# Patient Record
Sex: Female | Born: 1965 | Race: White | Hispanic: No | Marital: Single | Smoking: Former smoker
Health system: Southern US, Community
[De-identification: ages and names within clinical notes are randomized; demographics above are authoritative.]

## PROBLEM LIST (undated history)

## (undated) DIAGNOSIS — D759 Disease of blood and blood-forming organs, unspecified: Secondary | ICD-10-CM

## (undated) DIAGNOSIS — E785 Hyperlipidemia, unspecified: Secondary | ICD-10-CM

## (undated) DIAGNOSIS — L109 Pemphigus, unspecified: Secondary | ICD-10-CM

## (undated) DIAGNOSIS — F419 Anxiety disorder, unspecified: Secondary | ICD-10-CM

## (undated) HISTORY — PX: NO PAST SURGERIES: SHX2092

---

## 2004-03-26 ENCOUNTER — Ambulatory Visit: Payer: Self-pay | Admitting: Unknown Physician Specialty

## 2004-05-17 ENCOUNTER — Ambulatory Visit: Payer: Self-pay | Admitting: Internal Medicine

## 2004-05-31 ENCOUNTER — Ambulatory Visit: Payer: Self-pay | Admitting: Internal Medicine

## 2006-01-09 ENCOUNTER — Ambulatory Visit: Payer: Self-pay

## 2006-09-22 HISTORY — PX: AUGMENTATION MAMMAPLASTY: SUR837

## 2010-06-06 ENCOUNTER — Ambulatory Visit: Payer: Self-pay

## 2010-12-21 ENCOUNTER — Other Ambulatory Visit: Payer: Self-pay | Admitting: Obstetrics and Gynecology

## 2010-12-21 DIAGNOSIS — Z1231 Encounter for screening mammogram for malignant neoplasm of breast: Secondary | ICD-10-CM

## 2010-12-31 ENCOUNTER — Ambulatory Visit
Admission: RE | Admit: 2010-12-31 | Discharge: 2010-12-31 | Disposition: A | Discharge status: Home / Self Care | Payer: Managed Care, Other (non HMO) | Source: Ambulatory Visit | Attending: Obstetrics and Gynecology | Admitting: Obstetrics and Gynecology

## 2010-12-31 DIAGNOSIS — Z1231 Encounter for screening mammogram for malignant neoplasm of breast: Secondary | ICD-10-CM

## 2011-01-01 ENCOUNTER — Other Ambulatory Visit: Payer: Self-pay | Admitting: Obstetrics and Gynecology

## 2011-01-01 DIAGNOSIS — R928 Other abnormal and inconclusive findings on diagnostic imaging of breast: Secondary | ICD-10-CM

## 2011-01-17 ENCOUNTER — Ambulatory Visit
Admission: RE | Admit: 2011-01-17 | Discharge: 2011-01-17 | Disposition: A | Discharge status: Home / Self Care | Payer: Managed Care, Other (non HMO) | Source: Ambulatory Visit | Attending: Obstetrics and Gynecology | Admitting: Obstetrics and Gynecology

## 2011-01-17 DIAGNOSIS — R928 Other abnormal and inconclusive findings on diagnostic imaging of breast: Secondary | ICD-10-CM

## 2011-09-03 ENCOUNTER — Observation Stay (HOSPITAL_COMMUNITY)
Admission: EM | Admit: 2011-09-03 | Discharge: 2011-09-04 | Disposition: A | Discharge status: Home / Self Care | Payer: 59 | Attending: Family Medicine | Admitting: Family Medicine

## 2011-09-03 ENCOUNTER — Encounter (HOSPITAL_COMMUNITY): Payer: Self-pay | Admitting: Emergency Medicine

## 2011-09-03 ENCOUNTER — Emergency Department (HOSPITAL_COMMUNITY): Payer: 59

## 2011-09-03 ENCOUNTER — Observation Stay (HOSPITAL_COMMUNITY): Payer: 59

## 2011-09-03 DIAGNOSIS — L100 Pemphigus vulgaris: Secondary | ICD-10-CM | POA: Diagnosis present

## 2011-09-03 DIAGNOSIS — R29898 Other symptoms and signs involving the musculoskeletal system: Secondary | ICD-10-CM | POA: Insufficient documentation

## 2011-09-03 DIAGNOSIS — Z23 Encounter for immunization: Secondary | ICD-10-CM | POA: Insufficient documentation

## 2011-09-03 DIAGNOSIS — G459 Transient cerebral ischemic attack, unspecified: Principal | ICD-10-CM | POA: Diagnosis present

## 2011-09-03 DIAGNOSIS — R4789 Other speech disturbances: Secondary | ICD-10-CM | POA: Insufficient documentation

## 2011-09-03 DIAGNOSIS — F411 Generalized anxiety disorder: Secondary | ICD-10-CM | POA: Insufficient documentation

## 2011-09-03 DIAGNOSIS — R209 Unspecified disturbances of skin sensation: Secondary | ICD-10-CM | POA: Insufficient documentation

## 2011-09-03 DIAGNOSIS — E785 Hyperlipidemia, unspecified: Secondary | ICD-10-CM | POA: Diagnosis present

## 2011-09-03 DIAGNOSIS — R413 Other amnesia: Secondary | ICD-10-CM | POA: Diagnosis present

## 2011-09-03 DIAGNOSIS — F419 Anxiety disorder, unspecified: Secondary | ICD-10-CM | POA: Diagnosis present

## 2011-09-03 DIAGNOSIS — R29810 Facial weakness: Secondary | ICD-10-CM | POA: Insufficient documentation

## 2011-09-03 DIAGNOSIS — L109 Pemphigus, unspecified: Secondary | ICD-10-CM | POA: Insufficient documentation

## 2011-09-03 HISTORY — DX: Disease of blood and blood-forming organs, unspecified: D75.9

## 2011-09-03 HISTORY — DX: Anxiety disorder, unspecified: F41.9

## 2011-09-03 HISTORY — DX: Pemphigus, unspecified: L10.9

## 2011-09-03 HISTORY — DX: Hyperlipidemia, unspecified: E78.5

## 2011-09-03 LAB — CBC
MCH: 31.1 pg (ref 26.0–34.0)
MCV: 94.5 fL (ref 78.0–100.0)
Platelets: 292 10*3/uL (ref 150–400)
RDW: 13.1 % (ref 11.5–15.5)

## 2011-09-03 LAB — GLUCOSE, CAPILLARY
Glucose-Capillary: 162 mg/dL — ABNORMAL HIGH (ref 70–99)
Glucose-Capillary: 104 mg/dL — ABNORMAL HIGH (ref 70–99)

## 2011-09-03 LAB — COMPREHENSIVE METABOLIC PANEL
AST: 14 U/L (ref 0–37)
Albumin: 3.8 g/dL (ref 3.5–5.2)
Calcium: 8.6 mg/dL (ref 8.4–10.5)
Creatinine, Ser: 1.08 mg/dL (ref 0.50–1.10)
Total Protein: 6.3 g/dL (ref 6.0–8.3)

## 2011-09-03 LAB — PROTIME-INR: INR: 1.08 (ref 0.00–1.49)

## 2011-09-03 LAB — HEMOGLOBIN A1C
Hgb A1c MFr Bld: 5.8 % — ABNORMAL HIGH (ref <5.7)
Mean Plasma Glucose: 120 mg/dL — ABNORMAL HIGH (ref <117)

## 2011-09-03 LAB — RAPID URINE DRUG SCREEN, HOSP PERFORMED
Amphetamines: NOT DETECTED
Benzodiazepines: NOT DETECTED
Opiates: NOT DETECTED

## 2011-09-03 LAB — APTT: aPTT: 31 seconds (ref 24–37)

## 2011-09-03 LAB — LIPID PANEL
Total CHOL/HDL Ratio: 1.8 RATIO
VLDL: 10 mg/dL (ref 0–40)

## 2011-09-03 LAB — C4 COMPLEMENT: Complement C4, Body Fluid: 26 mg/dL (ref 10–40)

## 2011-09-03 MED ORDER — PREDNISONE 5 MG PO TABS
15.0000 mg | ORAL_TABLET | Freq: Every day | ORAL | Status: DC
  Administered 2011-09-03 – 2011-09-04 (×2): 15 mg via ORAL
  Filled 2011-09-03 (×3): qty 1

## 2011-09-03 MED ORDER — MYCOPHENOLATE MOFETIL 250 MG PO CAPS
1500.0000 mg | ORAL_CAPSULE | Freq: Two times a day (BID) | ORAL | Status: DC
  Administered 2011-09-03 – 2011-09-04 (×3): 1500 mg via ORAL
  Filled 2011-09-03 (×4): qty 6

## 2011-09-03 MED ORDER — ALPRAZOLAM 0.5 MG PO TABS
0.5000 mg | ORAL_TABLET | Freq: Two times a day (BID) | ORAL | Status: DC | PRN
  Administered 2011-09-03 – 2011-09-04 (×2): 0.5 mg via ORAL
  Filled 2011-09-03 (×2): qty 1

## 2011-09-03 MED ORDER — ENOXAPARIN SODIUM 40 MG/0.4ML ~~LOC~~ SOLN
40.0000 mg | Freq: Every day | SUBCUTANEOUS | Status: DC
  Administered 2011-09-03 – 2011-09-04 (×2): 40 mg via SUBCUTANEOUS
  Filled 2011-09-03 (×2): qty 0.4

## 2011-09-03 MED ORDER — ZOLPIDEM TARTRATE 5 MG PO TABS
5.0000 mg | ORAL_TABLET | Freq: Every day | ORAL | Status: DC
  Administered 2011-09-03: 5 mg via ORAL
  Filled 2011-09-03: qty 1

## 2011-09-03 MED ORDER — SODIUM CHLORIDE 0.9 % IV SOLN
INTRAVENOUS | Status: DC
  Administered 2011-09-03: 05:00:00 via INTRAVENOUS
  Administered 2011-09-04: 75 mL/h via INTRAVENOUS

## 2011-09-03 MED ORDER — ASPIRIN 325 MG PO TABS
325.0000 mg | ORAL_TABLET | Freq: Every day | ORAL | Status: DC
  Administered 2011-09-03 – 2011-09-04 (×2): 325 mg via ORAL
  Filled 2011-09-03 (×2): qty 1

## 2011-09-03 MED ORDER — LAMOTRIGINE 200 MG PO TABS
400.0000 mg | ORAL_TABLET | Freq: Every day | ORAL | Status: DC
  Administered 2011-09-03: 400 mg via ORAL
  Filled 2011-09-03 (×2): qty 2

## 2011-09-03 MED ORDER — PNEUMOCOCCAL VAC POLYVALENT 25 MCG/0.5ML IJ INJ
0.5000 mL | INJECTION | INTRAMUSCULAR | Status: AC
  Administered 2011-09-04: 0.5 mL via INTRAMUSCULAR
  Filled 2011-09-03: qty 0.5

## 2011-09-03 NOTE — Progress Notes (Signed)
Called by Dr. Rito Ehrlich with internal medicine to arrange outpatient monitoring to look for arrhythmia given admission for TIA. Discussed with Dr. Excell Seltzer [facility] DOD) - we will assign Excell Seltzer to receive the results to ensure they are reviewed given that she was not seen by cardiology this admission. This will be a 21-day continuous monitor. She can pick up the monitor Thursday 09/05/11 at 10:30am at [facility].  Jaimey Franchini PA-C

## 2011-09-03 NOTE — Code Documentation (Signed)
Patient arrived EMS due to difficulty speaking and right arm heaviness. The patient is a poor historian and patient's husband's report differs on when the patient went to bed normal and when she woke up with symptoms. The patient reports she went to bed at 2130 and awoke at 2300 with symptoms, the patient's husband reports she went to bed at 2300 and awoke at midnight with symptoms. EMS did not encode the patient, code stroke was called by EDP after arrival to ED. Code stroke called at 0117, patient arrived at 0053, EDP exam at 0115, stroke team arrived at 0120, LSN is unclear, patient arrived to CT at 0127, phlebotomist arrived at 0120, CT read by Dr. Roseanne Reno at [phone]. NIH 0, code stroke cancelled at 0142.

## 2011-09-03 NOTE — ED Notes (Signed)
REPORT GIVEN TO 4 NORTH FLOOR NURSE , TRANSPORTED IN STABLE CONDITION , DENIES PAIN OR DISCOMFORT. RESPIRATIONS UNLABORED.

## 2011-09-03 NOTE — Progress Notes (Signed)
*  PRELIMINARY RESULTS* Vascular Ultrasound Carotid Duplex (Doppler) has been completed.  Preliminary findings: Bilaterally no significant ICA stenosis with antegrade vertebral flow.  Farrel Demark, RDMS, RVT  09/03/2011, 3:30 PM

## 2011-09-03 NOTE — ED Notes (Signed)
[phone] Heather Baxter cell. [phone] Heather Baxter home

## 2011-09-03 NOTE — Progress Notes (Signed)
Utilization review complete 

## 2011-09-03 NOTE — Progress Notes (Signed)
Stroke Team Progress Note  HISTORY Heather Baxter is an 46 y.o. female with a history of hyperlipidemia and chronic anxiety, presenting with history of transient slurring of speech as well as right facial and upper extremity weakness. She also had a feeling of tingling involving her right hand. Patient was last known normal at about 9:30 PM on 09/01/2101. She woke up at 2300 with current symptoms. Symptoms resolved shortly after arriving in the emergency room. There is no previous history stroke nor TIA. Patient has not been on antiplatelet therapy. NIH stroke score at this point is 0. Patient was not a TPA candidate secondary to resolution of symptoms. She was admitted for further evaluation and treatment.  SUBJECTIVE Her husband is at the bedside.  Overall she feels her condition is unchanged. Symptoms lasted ~ 5 minutes last night.  OBJECTIVE Most recent Vital Signs: Filed Vitals:   09/03/11 0500 09/03/11 0937 09/03/11 1407 09/03/11 1431  BP: 122/75 120/68 114/68 149/54  Pulse: 71 67 65 67  Temp: 98.1 F (36.7 C) 98.5 F (36.9 C) 98.3 F (36.8 C) 98.4 F (36.9 C)  TempSrc:  Oral Oral Oral  Resp: 18 18 18 18   Height: 5\' 4"  (1.626 m)     Weight: 78.518 kg (173 lb 1.6 oz)     SpO2: 99% 99% 98% 98%   CBG (last 3)  Basename 09/03/11 1146 09/03/11 0313  GLUCAP 101* 162*   IV Fluid Intake:     . sodium chloride 75 mL/hr at 09/03/11 0502   MEDICATIONS    . aspirin  325 mg Oral Daily  . enoxaparin (LOVENOX) injection  40 mg Subcutaneous Daily  . lamoTRIgine  400 mg Oral QHS  . mycophenolate  1,500 mg Oral BID  . pneumococcal 23 valent vaccine  0.5 mL Intramuscular Tomorrow-1000  . predniSONE  15 mg Oral Q breakfast  . zolpidem  5 mg Oral QHS   PRN:    Diet:  Cardiac thin liquids Activity:   Bathroom privileges with assistance DVT Prophylaxis:  Lovenox 40 mg sq daily   CLINICALLY SIGNIFICANT STUDIES Basic Metabolic Panel:  Lab 09/03/11 1478  NA 140  K 3.6  CL 107  CO2 24   GLUCOSE 154*  BUN 14  CREATININE 1.08  CALCIUM 8.6  MG --  PHOS --   Liver Function Tests:  Lab 09/03/11 0120  AST 14  ALT 13  ALKPHOS 32*  BILITOT 0.4  PROT 6.3  ALBUMIN 3.8   CBC:  Lab 09/03/11 0120  WBC 9.0  NEUTROABS --  HGB 13.5  HCT 41.0  MCV 94.5  PLT 292   Coagulation:  Lab 09/03/11 0120  LABPROT 14.2  INR 1.08   Cardiac Enzymes:  Lab 09/03/11 0250  CKTOTAL --  CKMB --  CKMBINDEX --  TROPONINI <0.30   Urinalysis: No results found for this basename: COLORURINE:2,APPERANCEUR:2,LABSPEC:2,PHURINE:2,GLUCOSEU:2,HGBUR:2,BILIRUBINUR:2,KETONESUR:2,PROTEINUR:2,UROBILINOGEN:2,NITRITE:2,LEUKOCYTESUR:2 in the last 168 hours Lipid Panel    Component Value Date/Time   CHOL 169 09/03/2011 0700   TRIG 48 09/03/2011 0700   HDL 92 09/03/2011 0700   CHOLHDL 1.8 09/03/2011 0700   VLDL 10 09/03/2011 0700   LDLCALC 67 09/03/2011 0700   HgbA1C  Lab Results  Component Value Date   HGBA1C 5.8* 09/03/2011    Urine Drug Screen:     Component Value Date/Time   LABOPIA NONE DETECTED 09/03/2011 0230   COCAINSCRNUR NONE DETECTED 09/03/2011 0230   LABBENZ NONE DETECTED 09/03/2011 0230   AMPHETMU NONE DETECTED 09/03/2011 0230   THCU NONE  DETECTED 09/03/2011 0230   LABBARB NONE DETECTED 09/03/2011 0230    Alcohol Level: No results found for this basename: ETH:2 in the last 168 hours  CT of the brain  09/03/2011   Negative CT head.   MRI of the brain  09/03/2011   No acute infarct.  Minimal nonspecific white matter type changes as noted above. : Mild branch vessel irregularity.  MRA of the brain  09/03/2011 Mild branch vessel irregularity.   2D Echocardiogram    Carotid Doppler    CXR  09/03/2011   No acute cardiopulmonary process.    EKG     Therapy Recommendations no rehab needs  Physical Exam    GENERAL EXAM: Patient is in no distress  CARDIOVASCULAR: Regular rate and rhythm, no murmurs, no carotid bruits  NEUROLOGIC: MENTAL STATUS: awake, alert, language fluent,  comprehension intact, naming intact CRANIAL NERVE: pupils equal and reactive to light, visual fields full to confrontation, extraocular muscles intact, no nystagmus, facial sensation and strength symmetric, uvula midline, shoulder shrug symmetric, tongue midline. MOTOR: normal bulk and tone, full strength in the BUE, BLE SENSORY: normal and symmetric to light touch, pinprick, temperature, vibration COORDINATION: finger-nose-finger, fine finger movements normal REFLEXES: deep tendon reflexes present and symmetric  ASSESSMENT Ms. Heather Baxter is a 46 y.o. female presenting with transient slurred speech, right face and right arm weakness, right hand tingling. Imaging confirms no infarct. Symptoms secondary to TIA. Stroke workup underway.  On no antiplatelets prior to admission. Now on aspirin 325 mg orally every day for secondary stroke prevention. Given history of prior symptoms and nonspecific lesion on MRI, will consider outpatient workup of non-specific white matter spots on MRI brain (i.e. Multiple sclerosis workup).  -Pemphigus, on predisone, cellcept, lamictal -cigarette smoking,  Hospital day # 0  TREATMENT/PLAN - Continue aspirin 325 mg orally every day for secondary stroke prevention. - outpatient telemetry monitoring has been arranged to assess patient for atrial fibrillation as source of stroke with [facility].  - TEE tomorrow by [employer]. TEE to look for embolic source.  If positive for PFO (patent foramen ovale), check bilateral lower extremity venous dopplers to rule out DVT as possible source of stroke.  - check Hypercoagulable panel, vasculitic labs (C3, C4, CH50, ANA, ESR) and RPR  SHARON BIBY, MSN, RN, ANVP-BC, ANP-BC, GNP-BC [facility] Pager: [phone] 09/03/2011 2:59 PM  Scribe for Dr. Joycelyn Schmid, who has personally reviewed chart, pertinent data, examined the patient and developed the plan of care. Pager:  [phone]   [facility] - Stroke Team Joycelyn Schmid, MD 09/03/2011, 3:23 PM   Please refer to [email or web address] for on-call Stroke MD

## 2011-09-03 NOTE — Consult Note (Signed)
  Chief Complaint: Dizziness, dysarthria, right facial and upper extremity weakness.  HPI: Heather Baxter is an 46 y.o. female with a history of hyperlipidemia and chronic anxiety, presenting with history of transient slurring of speech as well as right facial and upper extremity weakness. She also had a feeling of tingling involving her right hand. Patient was last known normal at about 9:30 PM. She woke up at 2300 with current symptoms. Symptoms resolved shortly after arriving in the emergency room. There is no previous history stroke nor TIA. Patient has not been on antiplatelet therapy. NIH stroke score at this point is 0.  LSN: 9:30 PM on 09/02/2011 tPA Given: No: Rapid resolution of deficits MRankin: 0  Past Medical History  Diagnosis Date  . Anxiety     No family history on file.   Medications:  Prior to Admission: Lamictal 400 mg at bedtime CellCept 1500 mg twice a day Prednisone 15 mg per day Crestor 5 mg per day Ambien 5 mg at bedtime  Physical Examination: Blood pressure 146/96, pulse 96, temperature 98.6 F (37 C), temperature source Oral, resp. rate 11, SpO2 99.00%.  Neurologic Examination: Mental Status: Alert, oriented, thought content appropriate.  Speech fluent without evidence of aphasia. Able to follow commands without difficulty. Cranial Nerves: II-Visual fields were normal. III/IV/VI-Pupils were equal and reacted. Extraocular movements were full and conjugate.    V/VII-no facial numbness and no facial weakness. VIII-normal. X-normal speech and symmetrical palatal movement. Motor: 5/5 bilaterally with normal tone and bulk Sensory: Normal throughout. Deep Tendon Reflexes: 2+ and symmetric. Plantars: Flexor bilaterally Cerebellar: Normal finger-to-nose testing. Carotid auscultation: Normal   Ct Head Wo Contrast  09/03/2011  *RADIOLOGY REPORT*  Clinical Data:   Stroke.  Arm heaviness.  CT HEAD WITHOUT CONTRAST  Technique:  Contiguous axial images were  obtained from the base of the skull through the vertex without contrast.  Comparison: None.  Findings: No mass lesion, mass effect, midline shift, hydrocephalus, hemorrhage.  No territorial ischemia or acute infarction.  Paranasal sinuses are within normal limits.  IMPRESSION: Negative CT head.  Critical Value/emergent results were called by telephone at the time of interpretation on 09/03/2011 at 0134 hours to Dr. Roseanne Reno, who verbally acknowledged these results.  Original Report Authenticated By: Andreas Newport, M.D.    Assessment: 46 y.o. female presenting with possible left subcortical small vessel MCA territory TIA. Small vessel ischemic stroke cannot be ruled out at this point as well.  Stroke Risk Factors - hyperlipidemia  Plan: 1. HgbA1c, fasting lipid panel 2. MRI, MRA  of the brain without contrast 3. Echocardiogram 4. Carotid dopplers 5. Prophylactic therapy-Antiplatelet med: Aspirin 81 mg per day 6. Risk factor modification 7. Telemetry monitoring  C.R. Roseanne Reno, MD [facility] [phone]  09/03/2011, 1:49 AM

## 2011-09-03 NOTE — ED Notes (Signed)
Patient with slurred speech and difficulty swallowing and speaking.  Patient LSN at 11pm.  Went to sleep, woke up at 1am, now having problems and having trouble walking.

## 2011-09-03 NOTE — ED Provider Notes (Signed)
History     CSN: [id number]  Arrival date & time 09/03/11  0053   First MD Initiated Contact with Patient 09/03/11 0241      CC: Code Stroke  (Consider location/radiation/quality/duration/timing/severity/associated sxs/prior treatment) HPI Heather Baxter is an 46 y.o. female with a history of hyperlipidemia and chronic anxiety, presenting with history of transient slurring of speech as well as right facial and upper extremity weakness. She also had a feeling of tingling involving her right hand. Patient was last known normal at about 9:30 PM. She woke up at 2300 with current symptoms. Symptoms resolved shortly after arriving in the emergency room. There is no previous history stroke nor TIA. She is no headache or confusion. She is no chest pain palpitations or shortness breath. She is no nausea vomiting. She is no vertigo. She now feels normal. Her symptoms were mild and now resolved.tPA in stroke considered, but not given due to the following: Symptoms resolved   Past Medical History  Diagnosis Date  . Anxiety   . Pemphigus   . Hyperlipidemia     Past Surgical History  Procedure Date  . No past surgeries     Family History  Problem Relation Age of Onset  . Hypertension Other     History  Substance Use Topics  . Smoking status: Former Games developer  . Smokeless tobacco: Not on file  . Alcohol Use: Yes     occassionaly    OB History    Grav Para Term Preterm Abortions TAB SAB Ect Mult Living                  Review of Systems 10 Systems reviewed and are negative for acute change except as noted in the HPI. Allergies  Review of patient's allergies indicates no known allergies.  Home Medications   No current outpatient prescriptions on file.  BP 125/77  Pulse 75  Temp 98.1 F (36.7 C) (Oral)  Resp 18  Ht 5\' 4"  (1.626 m)  Wt 173 lb 1.6 oz (78.518 kg)  BMI 29.71 kg/m2  SpO2 98%  Physical Exam  Nursing note and vitals reviewed. Constitutional:       Awake,  alert, nontoxic appearance with baseline speech for patient.  HENT:  Head: Atraumatic.  Mouth/Throat: No oropharyngeal exudate.  Eyes: EOM are normal. Pupils are equal, round, and reactive to light. Right eye exhibits no discharge. Left eye exhibits no discharge.  Neck: Neck supple.  Cardiovascular: Normal rate and regular rhythm.   No murmur heard. Pulmonary/Chest: Effort normal and breath sounds normal. No stridor. No respiratory distress. She has no wheezes. She has no rales. She exhibits no tenderness.  Abdominal: Soft. Bowel sounds are normal. She exhibits no mass. There is no tenderness. There is no rebound.  Musculoskeletal: She exhibits no tenderness.       Baseline ROM, moves extremities with no obvious new focal weakness.  Lymphadenopathy:    She has no cervical adenopathy.  Neurological:       Awake, alert, cooperative and aware of situation; motor strength bilaterally; sensation normal to light touch bilaterally; peripheral visual fields full to confrontation; no facial asymmetry; tongue midline; major cranial nerves appear intact; no pronator drift, normal finger to nose bilaterally  Skin: No rash noted.  Psychiatric: She has a normal mood and affect.    ED Course  Procedures (including critical care time)  Labs Reviewed  COMPREHENSIVE METABOLIC PANEL - Abnormal; Notable for the following:    Glucose, Bld 154 (*)  Alkaline Phosphatase 32 (*)     GFR calc non Af Amer 61 (*)     GFR calc Af Amer 71 (*)     All other components within normal limits  GLUCOSE, CAPILLARY - Abnormal; Notable for the following:    Glucose-Capillary 162 (*)     All other components within normal limits  HEMOGLOBIN A1C - Abnormal; Notable for the following:    Hemoglobin A1C 5.8 (*)     Mean Plasma Glucose 120 (*)     All other components within normal limits  GLUCOSE, CAPILLARY - Abnormal; Notable for the following:    Glucose-Capillary 101 (*)     All other components within normal  limits  GLUCOSE, CAPILLARY - Abnormal; Notable for the following:    Glucose-Capillary 104 (*)     All other components within normal limits  CBC  PROTIME-INR  URINE RAPID DRUG SCREEN (HOSP PERFORMED)  PREGNANCY, URINE  APTT  TROPONIN I  LIPID PANEL  ANTITHROMBIN III  SEDIMENTATION RATE  GLUCOSE, POCT (MANUAL RESULT ENTRY)  GLUCOSE, POCT (MANUAL RESULT ENTRY)  GLUCOSE, POCT (MANUAL RESULT ENTRY)  GLUCOSE, POCT (MANUAL RESULT ENTRY)  CBC  PROTEIN C ACTIVITY  PROTEIN C, TOTAL  PROTEIN S ACTIVITY  PROTEIN S, TOTAL  LUPUS ANTICOAGULANT PANEL  BETA-2-GLYCOPROTEIN I ABS, IGG/M/A  HOMOCYSTEINE, SERUM  FACTOR 5 LEIDEN  CARDIOLIPIN ANTIBODIES, IGG, IGM, IGA  RPR  COMPLEMENT, TOTAL  ANA  C4 COMPLEMENT  C3 COMPLEMENT   Ct Head Wo Contrast  09/03/2011  *RADIOLOGY REPORT*  Clinical Data:   Stroke.  Arm heaviness.  CT HEAD WITHOUT CONTRAST  Technique:  Contiguous axial images were obtained from the base of the skull through the vertex without contrast.  Comparison: None.  Findings: No mass lesion, mass effect, midline shift, hydrocephalus, hemorrhage.  No territorial ischemia or acute infarction.  Paranasal sinuses are within normal limits.  IMPRESSION: Negative CT head.  Critical Value/emergent results were called by telephone at the time of interpretation on 09/03/2011 at 0134 hours to Dr. Roseanne Reno, who verbally acknowledged these results.  Original Report Authenticated By: Andreas Newport, M.D.   Mri Brain Without Contrast  09/03/2011  *RADIOLOGY REPORT*  Clinical Data:  Difficulty speaking with right facial droop and numbness right hand.  Hyperlipidemia.  MRI BRAIN WITHOUT CONTRAST MRA HEAD WITHOUT CONTRAST  Technique: Multiplanar, multiecho pulse sequences of the brain and surrounding structures were obtained according to standard protocol without intravenous contrast.  Angiographic images of the head were obtained using MRA technique without contrast.  Comparison: 09/03/2011 CT.  No  comparison MR.  MRI HEAD  Findings:  No acute infarct.  No intracranial hemorrhage.  No intracranial mass lesion detected on this unenhanced exam.  No hydrocephalus.  Minimal punctate/patchy white matter type changes most notable superior frontal lobes may represent changes of small vessel disease in this hyperlipidemic patient.  Demyelinating process not entirely excluded.  Other considerations include that secondary to the; migraine headaches, vasculitis, prior trauma or inflammatory process.  Minimal exophthalmos.  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  Air-fluid level right maxillary sinus versus polypoid lesion. Minimal mucosal thickening ethmoid sinus air cells.  IMPRESSION: No acute infarct.  Minimal nonspecific white matter type changes as noted above.  MRA HEAD  Findings: Nonvisualization left PICA.  Duplicated left AICA.  No high-grade stenosis of the distal vertebral arteries or basilar artery.  Anterior circulation without medium or large size vessel significant stenosis or occlusion.  Bulge of the right middle cerebral artery and left posterior communicating artery  origin without discrete aneurysm as these appear to be origin of the vessels.  Mild branch vessel irregularity.  IMPRESSION: Mild branch vessel irregularity.  Please see above.  Original Report Authenticated By: Fuller Canada, M.D.   Dg Chest Port 1 View  09/03/2011  *RADIOLOGY REPORT*  Clinical Data: Smoking history.  Rule out mass.  PORTABLE CHEST - 1 VIEW  Comparison: MRI brain 09/03/2011  Findings: Normal cardiac silhouette.  No effusion, infiltrate, pneumothorax.  No evidence of lung mass.  IMPRESSION: No acute cardiopulmonary process.  Original Report Authenticated By: Genevive Bi, M.D.   Mr Maxine Glenn Head/brain Wo Cm  09/03/2011  *RADIOLOGY REPORT*  Clinical Data:  Difficulty speaking with right facial droop and numbness right hand.  Hyperlipidemia.  MRI BRAIN WITHOUT CONTRAST MRA HEAD WITHOUT CONTRAST  Technique: Multiplanar, multiecho  pulse sequences of the brain and surrounding structures were obtained according to standard protocol without intravenous contrast.  Angiographic images of the head were obtained using MRA technique without contrast.  Comparison: 09/03/2011 CT.  No comparison MR.  MRI HEAD  Findings:  No acute infarct.  No intracranial hemorrhage.  No intracranial mass lesion detected on this unenhanced exam.  No hydrocephalus.  Minimal punctate/patchy white matter type changes most notable superior frontal lobes may represent changes of small vessel disease in this hyperlipidemic patient.  Demyelinating process not entirely excluded.  Other considerations include that secondary to the; migraine headaches, vasculitis, prior trauma or inflammatory process.  Minimal exophthalmos.  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  Air-fluid level right maxillary sinus versus polypoid lesion. Minimal mucosal thickening ethmoid sinus air cells.  IMPRESSION: No acute infarct.  Minimal nonspecific white matter type changes as noted above.  MRA HEAD  Findings: Nonvisualization left PICA.  Duplicated left AICA.  No high-grade stenosis of the distal vertebral arteries or basilar artery.  Anterior circulation without medium or large size vessel significant stenosis or occlusion.  Bulge of the right middle cerebral artery and left posterior communicating artery origin without discrete aneurysm as these appear to be origin of the vessels.  Mild branch vessel irregularity.  IMPRESSION: Mild branch vessel irregularity.  Please see above.  Original Report Authenticated By: Fuller Canada, M.D.     1. TIA (transient ischemic attack)   2. Pemphigus vulgaris       MDM  Pt stable in ED with no significant deterioration in condition.Patient / Family / Caregiver informed of clinical course, understand medical decision-making process, and agree with plan.          Hurman Horn, MD 09/03/11 [phone]

## 2011-09-03 NOTE — Progress Notes (Signed)
[facility] PROGRESS NOTE  Heather Baxter MRN:[id number] DOB: 10/05/1965 DOA: 09/03/2011 PCP: [name], Janet S, MD  Assessment/Plan: Principal Problem:  *TIA (transient ischemic attack) Active Problems:  Pemphigus vulgaris  Hyperlipidemia  Anxiety  Memory loss or impairment  1. TIA:  Workup so far has been negative with preliminary carotid Dopplers normal, and normal MRI/MRA and 2-D echo. Fasting lipid panel noting good LDL results patient has been on Crestor for the last one year. A1c notes borderline but no actual diagnosis of diabetes (patient counseled on weight loss) and blood pressures in the hospital have been fine. Plan is for TEE tomorrow to be done by [facility] and if normal, patient can go home. She has a followup appointment with [facility] on Thursday 09/05/11 for Holter monitor. 2. Pemphigus full there is: Stable. 3. Hyperlipidemia: Crestor. Lipid profile looks good. 4. Anxiety: Patient on when necessary Xanax. Stable. 5. Memory loss: Patient reports this has been going on for some time and husband confided that it has been much more noticeable in the past few weeks. From the sound of events, I actually suspect that this may be related to her Ambien use and advised that patient change medications. This can be managed by her PCP.  Code Status: Full code Family Communication: Discussed plan with patient and husband Disposition Plan: Home tomorrow after TEE, if normal   Brief narrative: Patient presented with arm numbness and facial droop concerning for possible TIA which resolved in the emergency room. Workup in progress  Consultants:  Stroke service  Procedures:  Status post echocardiogram done 8/13: Unremarkable  Carotid Dopplers done 8/13: Preliminary normal  Antibiotics:  None  HPI/Subjective: Patient has no complaints. No facial drooping or arm numbness or slurred speech or confusion. No headache.  Objective: Filed Vitals:   09/03/11  0937 09/03/11 1407 09/03/11 1431 09/03/11 1733  BP: 120/68 114/68 149/54 125/77  Pulse: 67 65 67 75  Temp: 98.5 F (36.9 C) 98.3 F (36.8 C) 98.4 F (36.9 C) 98.1 F (36.7 C)  TempSrc: Oral Oral Oral Oral  Resp: 18 18 18 18  Height:      Weight:      SpO2: 99% 98% 98% 98%   No intake or output data in the 24 hours ending 09/03/11 1820 Filed Weights   09/03/11 0500  Weight: 78.518 kg (173 lb 1.6 oz)    Exam:   General:  Alert and oriented x3, no acute distress, looks about stated age, fatigued  HEENT: Normocephalic, atraumatic, mucous membranes are moist  Neck: Supple no JVD no carotid bruits  Cardiovascular: Regular rate and rhythm, S1-S2  Respiratory: Clear to auscultation bilaterally  Abdomen: Soft, nontender, nondistended, positive bowel sounds  Musculoskeletal: No clubbing or cyanosis or edema  Neurological: Cranial nerves II through XII are intact, no focal deficits  Data Reviewed: Basic Metabolic Panel:  Lab 09/03/11 0120  NA 140  K 3.6  CL 107  CO2 24  GLUCOSE 154*  BUN 14  CREATININE 1.08  CALCIUM 8.6  MG --  PHOS --   Liver Function Tests:  Lab 09/03/11 0120  AST 14  ALT 13  ALKPHOS 32*  BILITOT 0.4  PROT 6.3  ALBUMIN 3.8   CBC:  Lab 09/03/11 0120  WBC 9.0  NEUTROABS --  HGB 13.5  HCT 41.0  MCV 94.5  PLT 292   Cardiac Enzymes:  Lab 09/03/11 0250  CKTOTAL --  CKMB --  CKMBINDEX --  TROPONINI <0.30   CBG:  Lab 09/03/11   1634 09/03/11 1146 09/03/11 0313  GLUCAP 104* 101* 162*     Studies: Ct Head Wo Contrast  09/03/2011 IMPRESSION: Negative CT head.    Mri Brain Without Contrast  09/03/2011   IMPRESSION: No acute infarct.  Minimal nonspecific white matter type changes as noted above.    MRA HEAD  Findings: Nonvisualization left PICA.  Duplicated left AICA.  No high-grade stenosis of the distal vertebral arteries or basilar artery.  Anterior circulation without medium or large size vessel significant stenosis or  occlusion.  Bulge of the right middle cerebral artery and left posterior communicating artery origin without discrete aneurysm as these appear to be origin of the vessels.  Mild branch vessel irregularity.  IMPRESSION: Mild branch vessel irregularity.  Dg Chest Port 1 View    IMPRESSION: No acute cardiopulmonary process.    Scheduled Meds:   . aspirin  325 mg Oral Daily  . enoxaparin (LOVENOX) injection  40 mg Subcutaneous Daily  . lamoTRIgine  400 mg Oral QHS  . mycophenolate  1,500 mg Oral BID  . pneumococcal 23 valent vaccine  0.5 mL Intramuscular Tomorrow-1000  . predniSONE  15 mg Oral Q breakfast  . zolpidem  5 mg Oral QHS   Continuous Infusions:   . sodium chloride 75 mL/hr at 09/03/11 0502     Time spent: 40 minutes    Milind Raether K  [facility] Pager [phone]. If 8PM-8AM, please contact night-coverage at [email or web address], password [email or web address] 09/03/2011, 6:20 PM  LOS: 0 days              

## 2011-09-03 NOTE — H&P (Signed)
Heather Baxter is an 46 y.o. female.   Patient was seen and examined on September 03, 2011 at 4:41 AM. PCP - none. Chief Complaint: Difficulty speaking with right facial droop and numbness of the right hand. HPI: 45 year-old female with history of pemphigus, anxiety, hyperlipidemia around last midnight was about to go to the bathroom when she suddenly felt dizzy and felt her right hand was getting numb and when she called her husband he fell that her right side of the face was drooping. They called the EMS and was brought to the ER. By the time she reached ER most of her symptoms are resolved except for mild numbness of the right hand. Patient did not lose consciousness and did not have any loss of strength in upper or lower extremities. Denies any palpitations chest pain or shortness of breath. CT of the head was negative for any acute. Neurologist on-call Dr. Roseanne Reno had seen the patient and will be admitted for further workup TIA.  Past Medical History  Diagnosis Date  . Anxiety   . Pemphigus   . Hyperlipidemia     Past Surgical History  Procedure Date  . No past surgeries     Family History  Problem Relation Age of Onset  . Hypertension Other    Social History:  reports that she has quit smoking. She does not have any smokeless tobacco history on file. She reports that she drinks alcohol. She reports that she does not use illicit drugs.  Allergies: No Known Allergies  Medications Prior to Admission  Medication Sig Dispense Refill  . lamoTRIgine (LAMICTAL) 200 MG tablet Take 400 mg by mouth at bedtime.      . mycophenolate (CELLCEPT) 500 MG tablet Take 1,500 mg by mouth 2 (two) times daily.      . predniSONE (DELTASONE) 5 MG tablet Take 15 mg by mouth daily.      [name] zolpidem (AMBIEN) 10 MG tablet Take 5 mg by mouth at bedtime.        Results for orders placed during the hospital encounter of 09/03/11 (from the past 48 hour(s))  CBC     Status: Normal   Collection Time   09/03/11   1:20 AM      Component Value Range Comment   WBC 9.0  4.0 - 10.5 K/uL    RBC 4.34  3.87 - 5.11 MIL/uL    Hemoglobin 13.5  12.0 - 15.0 g/dL    HCT 16.1  09.6 - 04.5 %    MCV 94.5  78.0 - 100.0 fL    MCH 31.1  26.0 - 34.0 pg    MCHC 32.9  30.0 - 36.0 g/dL    RDW 40.9  81.1 - 91.4 %    Platelets 292  150 - 400 K/uL   COMPREHENSIVE METABOLIC PANEL     Status: Abnormal   Collection Time   09/03/11  1:20 AM      Component Value Range Comment   Sodium 140  135 - 145 mEq/L    Potassium 3.6  3.5 - 5.1 mEq/L    Chloride 107  96 - 112 mEq/L    CO2 24  19 - 32 mEq/L    Glucose, Bld 154 (*) 70 - 99 mg/dL    BUN 14  6 - 23 mg/dL    Creatinine, Ser 7.82  0.50 - 1.10 mg/dL    Calcium 8.6  8.4 - 95.6 mg/dL    Total Protein 6.3  6.0 - 8.3  g/dL    Albumin 3.8  3.5 - 5.2 g/dL    AST 14  0 - 37 U/L    ALT 13  0 - 35 U/L    Alkaline Phosphatase 32 (*) 39 - 117 U/L    Total Bilirubin 0.4  0.3 - 1.2 mg/dL    GFR calc non Af Amer 61 (*) >90 mL/min    GFR calc Af Amer 71 (*) >90 mL/min   PROTIME-INR     Status: Normal   Collection Time   09/03/11  1:20 AM      Component Value Range Comment   Prothrombin Time 14.2  11.6 - 15.2 seconds    INR 1.08  0.00 - 1.49   URINE RAPID DRUG SCREEN (HOSP PERFORMED)     Status: Normal   Collection Time   09/03/11  2:30 AM      Component Value Range Comment   Opiates NONE DETECTED  NONE DETECTED    Cocaine NONE DETECTED  NONE DETECTED    Benzodiazepines NONE DETECTED  NONE DETECTED    Amphetamines NONE DETECTED  NONE DETECTED    Tetrahydrocannabinol NONE DETECTED  NONE DETECTED    Barbiturates NONE DETECTED  NONE DETECTED   PREGNANCY, URINE     Status: Normal   Collection Time   09/03/11  2:30 AM      Component Value Range Comment   Preg Test, Ur NEGATIVE  NEGATIVE   APTT     Status: Normal   Collection Time   09/03/11  2:50 AM      Component Value Range Comment   aPTT 31  24 - 37 seconds   TROPONIN I     Status: Normal   Collection Time   09/03/11  2:50  AM      Component Value Range Comment   Troponin I <0.30  <0.30 ng/mL   GLUCOSE, CAPILLARY     Status: Abnormal   Collection Time   09/03/11  3:13 AM      Component Value Range Comment   Glucose-Capillary 162 (*) 70 - 99 mg/dL    Ct Head Wo Contrast  09/03/2011  *RADIOLOGY REPORT*  Clinical Data:   Stroke.  Arm heaviness.  CT HEAD WITHOUT CONTRAST  Technique:  Contiguous axial images were obtained from the base of the skull through the vertex without contrast.  Comparison: None.  Findings: No mass lesion, mass effect, midline shift, hydrocephalus, hemorrhage.  No territorial ischemia or acute infarction.  Paranasal sinuses are within normal limits.  IMPRESSION: Negative CT head.  Critical Value/emergent results were called by telephone at the time of interpretation on 09/03/2011 at 0134 hours to Dr. Roseanne Reno, who verbally acknowledged these results.  Original Report Authenticated By: Andreas Newport, M.D.    Review of Systems  Constitutional: Negative.   HENT: Negative.   Eyes: Negative.   Respiratory: Negative.   Cardiovascular: Negative.   Gastrointestinal: Negative.   Genitourinary: Negative.   Musculoskeletal: Negative.   Skin: Negative.   Neurological: Positive for dizziness and speech change.       Drooping of face and numbness of right hand.  Endo/Heme/Allergies: Negative.   Psychiatric/Behavioral: Negative.     Blood pressure 127/86, pulse 82, temperature 98.3 F (36.8 C), temperature source Oral, resp. rate 14, SpO2 100.00%. Physical Exam  Constitutional: She is oriented to person, place, and time. She appears well-developed and well-nourished. No distress.  HENT:  Head: Normocephalic and atraumatic.  Right Ear: External ear normal.  Left Ear: External  ear normal.  Nose: Nose normal.  Mouth/Throat: Oropharynx is clear and moist. No oropharyngeal exudate.  Eyes: Conjunctivae are normal. Pupils are equal, round, and reactive to light. Right eye exhibits no discharge. Left  eye exhibits no discharge. No scleral icterus.  Neck: Normal range of motion. Neck supple.  Cardiovascular: Normal rate and regular rhythm.   Respiratory: Effort normal and breath sounds normal. No respiratory distress. She has no wheezes. She has no rales.  GI: Soft. Bowel sounds are normal. She exhibits no distension. There is no tenderness. There is no rebound.  Musculoskeletal: Normal range of motion. She exhibits no edema and no tenderness.  Neurological: She is alert and oriented to person, place, and time.       Moves all extremities 5/5. No facial asymmetry. Tongue is midline.  Skin: Skin is warm and dry. She is not diaphoretic.  Psychiatric: Her behavior is normal.     Assessment/Plan #1. Possible TIA - patient is placed on neurochecks, MRI/MRA brain has been ordered, swallow evaluation, 2-D echo and carotid Doppler. Telemetry shows sinus rhythm. #2. Hyperlipidemia - continue Crestor. #3. Anxiety - continue present medications. #4. Pemphigus - continue immunosuppressants.  CODE STATUS - full code.  Cailen Texeira N. 09/03/2011, 4:41 AM

## 2011-09-03 NOTE — Progress Notes (Signed)
*  PRELIMINARY RESULTS* Echocardiogram 2D Echocardiogram has been performed.  Heather Baxter 09/03/2011, 10:45 AM

## 2011-09-04 ENCOUNTER — Encounter (HOSPITAL_COMMUNITY): Payer: Self-pay | Admitting: *Deleted

## 2011-09-04 ENCOUNTER — Encounter (HOSPITAL_COMMUNITY)
Admission: EM | Disposition: A | Discharge status: Home / Self Care | Payer: Self-pay | Source: Home / Self Care | Attending: Emergency Medicine

## 2011-09-04 DIAGNOSIS — L109 Pemphigus, unspecified: Secondary | ICD-10-CM

## 2011-09-04 DIAGNOSIS — G459 Transient cerebral ischemic attack, unspecified: Secondary | ICD-10-CM

## 2011-09-04 HISTORY — PX: TEE WITHOUT CARDIOVERSION: SHX5443

## 2011-09-04 LAB — LUPUS ANTICOAGULANT PANEL: PTT Lupus Anticoagulant: 36.3 secs (ref 28.0–43.0)

## 2011-09-04 LAB — PROTEIN S ACTIVITY: Protein S Activity: 83 % (ref 69–129)

## 2011-09-04 LAB — GLUCOSE, CAPILLARY

## 2011-09-04 LAB — ANA: Anti Nuclear Antibody(ANA): NEGATIVE

## 2011-09-04 LAB — HOMOCYSTEINE: Homocysteine: 9.1 umol/L (ref 4.0–15.4)

## 2011-09-04 LAB — PROTEIN S, TOTAL: Protein S Ag, Total: 69 % (ref 60–150)

## 2011-09-04 LAB — FACTOR 5 LEIDEN

## 2011-09-04 SURGERY — ECHOCARDIOGRAM, TRANSESOPHAGEAL
Anesthesia: Moderate Sedation

## 2011-09-04 MED ORDER — FENTANYL CITRATE 0.05 MG/ML IJ SOLN
INTRAMUSCULAR | Status: DC | PRN
  Administered 2011-09-04: 25 ug via INTRAVENOUS
  Administered 2011-09-04: 50 ug via INTRAVENOUS
  Administered 2011-09-04: 25 ug via INTRAVENOUS

## 2011-09-04 MED ORDER — DIPHENHYDRAMINE HCL 50 MG/ML IJ SOLN
INTRAMUSCULAR | Status: AC
  Filled 2011-09-04: qty 1

## 2011-09-04 MED ORDER — FENTANYL CITRATE 0.05 MG/ML IJ SOLN
INTRAMUSCULAR | Status: AC
  Filled 2011-09-04: qty 4

## 2011-09-04 MED ORDER — BUTAMBEN-TETRACAINE-BENZOCAINE 2-2-14 % EX AERO
INHALATION_SPRAY | CUTANEOUS | Status: DC | PRN
  Administered 2011-09-04: 2 via TOPICAL

## 2011-09-04 MED ORDER — MIDAZOLAM HCL 5 MG/ML IJ SOLN
INTRAMUSCULAR | Status: AC
  Filled 2011-09-04: qty 2

## 2011-09-04 MED ORDER — MIDAZOLAM HCL 10 MG/2ML IJ SOLN
INTRAMUSCULAR | Status: DC | PRN
  Administered 2011-09-04 (×2): 1 mg via INTRAVENOUS
  Administered 2011-09-04: 2 mg via INTRAVENOUS
  Administered 2011-09-04: 1 mg via INTRAVENOUS
  Administered 2011-09-04: 2 mg via INTRAVENOUS

## 2011-09-04 MED ORDER — ASPIRIN 325 MG PO TABS: 325.0000 mg | ORAL_TABLET | Freq: Every day | ORAL | Status: AC

## 2011-09-04 NOTE — Discharge Summary (Signed)
[facility]                                                                                   [name], is a 46 y.o. female  DOB [birth year]  MRN [id number].  Admission date:  09/03/2011  Discharge Date:  09/04/2011  Primary MD  [name], Heather Iba, MD  Admitting Physician  Eduard Clos, MD  Admission Diagnosis  TIA (transient ischemic attack) [435.9] General Weakness stroke  Discharge Diagnosis     Principal Problem:  *TIA (transient ischemic attack) Active Problems:  Pemphigus vulgaris  Hyperlipidemia  Anxiety  Memory loss or impairment     Past Medical History  Diagnosis Date  . Anxiety   . Pemphigus   . Hyperlipidemia   . Blood dyscrasia     Past Surgical History  Procedure Date  . No past surgeries      Recommendations for primary care physician for things to follow:   Follow hypercoagulable labs   Discharge Diagnoses:   Principal Problem:  *TIA (transient ischemic attack) Active Problems:  Pemphigus vulgaris  Hyperlipidemia  Anxiety  Memory loss or impairment    Discharge Condition:  Stable  Diet recommendation:  Low salt diet  Consults  Neurology     History of present illness and  Hospital Course:   46 year-old female with history of pemphigus, anxiety, hyperlipidemia around last midnight was about to go to the bathroom when she suddenly felt dizzy and felt her right hand was getting numb and when she called her husband he fell that her right side of the face was drooping. They called the EMS and was brought to the ER. By the time she reached ER most of her symptoms are resolved except for mild numbness of the right hand. Patient did not lose consciousness and did not have any loss of strength in upper or lower extremities. Denies any palpitations chest pain or shortness of breath. CT of the head was negative for any acute. Neurologist on-call Dr. Roseanne Reno had seen the patient and will be admitted for further workup  TIA.  Hospital course TIA: patient was seen by neurology, work up for the TIA is negative. She also underwent TEE which was negative, patient is scheduled to follow up with [facility]ology for setting up 21 days continuous monitor as outpatient. She will be discharged on aspirin 325 mg po daily as per neurology recommendation. She will follow up with neurology in 2 months.  She also had hypercoagulable work up, and will follow with Dr [name] as outpatient to folllw the results.   Anxiety; Will follow up with her psychiatrist as outpatient Continue lamictal She will benefit form SSRI.  Pemphigus Continue mycophenolate, prednisone.  Memory loss Could be secondary to medications. She is on [place], lamictal. Will follow with PCP.  Today   Subjective:   Heather Baxter today has no headache,no chest abdominal pain,no new weakness tingling or numbness, feels much better wants to go home today.   Objective:   Blood pressure 106/61, pulse 86, temperature 98.4 F (36.9 C), temperature source Oral, resp. rate 18, height 5\' 4"  (1.626 m), weight 78.518 kg (173 lb 1.6 oz),  last menstrual period 08/26/2011, SpO2 99.00%.  No intake or output data in the 24 hours ending 09/04/11 1611  Exam Awake Alert, Oriented *3, No new F.N deficits, Normal affect [place].AT,PERRAL Supple Neck,No JVD, No cervical lymphadenopathy appriciated.  Symmetrical Chest wall movement, Good air movement bilaterally, CTAB RRR,No Gallops,Rubs or new Murmurs, No Parasternal Heave +ve B.Sounds, Abd Soft, Non tender, No organomegaly appriciated, No rebound -guarding or rigidity. No Cyanosis, Clubbing or edema, No new Rash or bruise  Data Review   Major procedures and Radiology Reports - PLEASE review detailed and final reports for all details in brief -   Carotid doppler;  Summary: No significant extracranial carotid artery stenosis demonstrated. Vertebrals are patent with antegrade flow.    Ct Head Wo  Contrast  09/03/2011  *RADIOLOGY REPORT*  Clinical Data:   Stroke.  Arm heaviness.  CT HEAD WITHOUT CONTRAST    IMPRESSION: Negative CT head.  Critical Value/emergent results were called by telephone at the time of interpretation on 09/03/2011 at 0134 hours to Dr. Roseanne Reno, who verbally acknowledged these results.  Original Report Authenticated By: Andreas Newport, M.D.   Mri Brain Without Contrast  09/03/2011  *RADIOLOGY REPORT*  Clinical Data:  Difficulty speaking with right facial droop and numbness right hand.  Hyperlipidemia.  MRI BRAIN WITHOUT CONTRAST MRA HEAD WITHOUT CONTRAST  Technique: Multiplanar, multiecho pulse sequences of the brain and surrounding structures were obtained according to standard protocol without intravenous contrast.  Angiographic images of the head were obtained using MRA technique without contrast.  Comparison: 09/03/2011 CT.  No comparison MR.  MRI HEAD  Findings:  No acute infarct.  No intracranial hemorrhage.  No intracranial mass lesion detected on this unenhanced exam.  No hydrocephalus.  Minimal punctate/patchy white matter type changes most notable superior frontal lobes may represent changes of small vessel disease in this hyperlipidemic patient.  Demyelinating process not entirely excluded.  Other considerations include that secondary to the; migraine headaches, vasculitis, prior trauma or inflammatory process.  Minimal exophthalmos.  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  Air-fluid level right maxillary sinus versus polypoid lesion. Minimal mucosal thickening ethmoid sinus air cells.  IMPRESSION: No acute infarct.  Minimal nonspecific white matter type changes as noted above.  MRA HEAD  Findings: Nonvisualization left PICA.  Duplicated left AICA.  No high-grade stenosis of the distal vertebral arteries or basilar artery.  Anterior circulation without medium or large size vessel significant stenosis or occlusion.  Bulge of the right middle cerebral artery and left posterior  communicating artery origin without discrete aneurysm as these appear to be origin of the vessels.  Mild branch vessel irregularity.  IMPRESSION: Mild branch vessel irregularity.  Please see above.  Original Report Authenticated By: Fuller Canada, M.D.   Dg Chest Port 1 View  09/03/2011  *RADIOLOGY REPORT*  Clinical Data: Smoking history.  Rule out mass.  PORTABLE CHEST - 1 VIEW  Comparison: MRI brain 09/03/2011  Findings: Normal cardiac silhouette.  No effusion, infiltrate, pneumothorax.  No evidence of lung mass.  IMPRESSION: No acute cardiopulmonary process.  Original Report Authenticated By: Genevive Bi, M.D.          CBC w Diff:  Lab Results  Component Value Date   WBC 9.0 09/03/2011   HGB 13.5 09/03/2011   HCT 41.0 09/03/2011   PLT 292 09/03/2011    CMP:  Lab Results  Component Value Date   NA 140 09/03/2011   K 3.6 09/03/2011   CL 107 09/03/2011   CO2 24 09/03/2011  BUN 14 09/03/2011   CREATININE 1.08 09/03/2011   PROT 6.3 09/03/2011   ALBUMIN 3.8 09/03/2011   BILITOT 0.4 09/03/2011   ALKPHOS 32* 09/03/2011   AST 14 09/03/2011   ALT 13 09/03/2011  .   Discharge Instructions       Follow-up Information    Follow up with [facility]. (Pick up heart monitor to wear -- 09/05/11 at 10:30am)    Contact information:   [address] [place] [address] [phone]      Follow up with Gates Rigg, MD in 2 months.   Contact information:   [address], [address] [facility] [place] [address] [phone]       Follow up with [name], Heather Iba, MD in 2 weeks.   Contact information:   [facility] [address] [place] [address] [phone]            Discharge Medications   Medication List  As of 09/04/2011  4:11 PM   START taking these medications         aspirin 325 MG tablet   Take 1 tablet (325 mg total) by mouth daily.         CONTINUE taking  these medications         lamoTRIgine 200 MG tablet   Commonly known as: LAMICTAL      mycophenolate 500 MG tablet   Commonly known as: CELLCEPT      predniSONE 5 MG tablet   Commonly known as: DELTASONE      zolpidem 10 MG tablet   Commonly known as: AMBIEN          Where to get your medications    These are the prescriptions that you need to pick up.   You may get these medications from any pharmacy.         aspirin 325 MG tablet               Total Time in preparing paper work, data evaluation and todays exam - 35 minutes  Mycah Mcdougall S M.D on 09/04/2011 at 4:11 PM  [facility] Office  [phone]

## 2011-09-04 NOTE — CV Procedure (Signed)
    Transesophageal Echocardiogram Note  Aijah Lattner [id number] 1965-10-07  Procedure: Transesophageal Echocardiogram Indications: TIA  Procedure Details Consent: Obtained Time Out: Verified patient identification, verified procedure, site/side was marked, verified correct patient position, special equipment/implants available, Radiology Safety Procedures followed,  medications/allergies/relevent history reviewed, required imaging and test results available.  Performed  Medications: Fentanyl: 100 mcg IV Versed: 7 mg IV  Left Ventrical:  Normal LV function  Mitral Valve: trace MR  Aortic Valve: normal AV  Tricuspid Valve: normal TV  Pulmonic Valve: normal PV  Left Atrium/ Left atrial appendage: no thrombi, no spontaneous contrast  Atrial septum: no ASD or PFO by color Doppler or bubble study  Aorta: normal   Complications: No apparent complications Patient did tolerate procedure well.   Vesta Mixer, Montez Hageman., MD, [facility] 09/04/2011, 11:36 AM

## 2011-09-04 NOTE — Progress Notes (Signed)
Stroke Team Progress Note  HISTORY Heather Baxter is an 46 y.o. female with a history of hyperlipidemia and chronic anxiety, presenting with history of transient slurring of speech as well as right facial and upper extremity weakness. She also had a feeling of tingling involving her right hand. Patient was last known normal at about 9:30 PM on 09/01/2101. She woke up at 2300 with current symptoms. Symptoms resolved shortly after arriving in the emergency room. There is no previous history stroke nor TIA. Patient has not been on antiplatelet therapy. NIH stroke score at this point is 0. Patient was not a TPA candidate secondary to resolution of symptoms. She was admitted for further evaluation and treatment.  SUBJECTIVE Recovering well after TEE. No new symptoms. Feels back to normal and looking forward to going home.  OBJECTIVE Most recent Vital Signs: Filed Vitals:   09/04/11 1150 09/04/11 1159 09/04/11 1200 09/04/11 1206  BP: 121/75 109/66 109/66 106/70  Pulse:      Temp:      TempSrc:      Resp: 15 15 16 14   Height:      Weight:      SpO2: 98% 99% 98% 94%   CBG (last 3)   Basename 09/04/11 0657 09/03/11 2214 09/03/11 1634  GLUCAP 87 115* 104*   IV Fluid Intake:      . sodium chloride 75 mL/hr (09/04/11 1052)   MEDICATIONS     . aspirin  325 mg Oral Daily  . enoxaparin (LOVENOX) injection  40 mg Subcutaneous Daily  . lamoTRIgine  400 mg Oral QHS  . mycophenolate  1,500 mg Oral BID  . pneumococcal 23 valent vaccine  0.5 mL Intramuscular Tomorrow-1000  . predniSONE  15 mg Oral Q breakfast  . zolpidem  5 mg Oral QHS   PRN:    Diet:  Cardiac thin liquids Activity:   Bathroom privileges with assistance DVT Prophylaxis:  Lovenox 40 mg sq daily   CLINICALLY SIGNIFICANT STUDIES Basic Metabolic Panel:   Lab 09/03/11 0120  NA 140  K 3.6  CL 107  CO2 24  GLUCOSE 154*  BUN 14  CREATININE 1.08  CALCIUM 8.6  MG --  PHOS --   Liver Function Tests:   Lab 09/03/11 0120   AST 14  ALT 13  ALKPHOS 32*  BILITOT 0.4  PROT 6.3  ALBUMIN 3.8   CBC:   Lab 09/03/11 0120  WBC 9.0  NEUTROABS --  HGB 13.5  HCT 41.0  MCV 94.5  PLT 292   Coagulation:   Lab 09/03/11 0120  LABPROT 14.2  INR 1.08   Cardiac Enzymes:   Lab 09/03/11 0250  CKTOTAL --  CKMB --  CKMBINDEX --  TROPONINI <0.30   Urinalysis: No results found for this basename: COLORURINE:2,APPERANCEUR:2,LABSPEC:2,PHURINE:2,GLUCOSEU:2,HGBUR:2,BILIRUBINUR:2,KETONESUR:2,PROTEINUR:2,UROBILINOGEN:2,NITRITE:2,LEUKOCYTESUR:2 in the last 168 hours Lipid Panel    Component Value Date/Time   CHOL 169 09/03/2011 0700   TRIG 48 09/03/2011 0700   HDL 92 09/03/2011 0700   CHOLHDL 1.8 09/03/2011 0700   VLDL 10 09/03/2011 0700   LDLCALC 67 09/03/2011 0700   HgbA1C  Lab Results  Component Value Date   HGBA1C 5.8* 09/03/2011    Urine Drug Screen:     Component Value Date/Time   LABOPIA NONE DETECTED 09/03/2011 0230   COCAINSCRNUR NONE DETECTED 09/03/2011 0230   LABBENZ NONE DETECTED 09/03/2011 0230   AMPHETMU NONE DETECTED 09/03/2011 0230   THCU NONE DETECTED 09/03/2011 0230   LABBARB NONE DETECTED 09/03/2011 0230    Alcohol  Level: No results found for this basename: ETH:2 in the last 168 hours  ESR, RPR, ANA, C3, C4, Anti III, homocysteine, Protein C & S total normal CH50, Prot C & S activity, lupus, Beta-2 glycoprotein, Factor V, CL abx pending  CT of the brain  09/03/2011   Negative CT head.   MRI of the brain  09/03/2011   No acute infarct.  Minimal nonspecific white matter type changes as noted above. : Mild branch vessel irregularity.  MRA of the brain  09/03/2011 Mild branch vessel irregularity.   2D Echocardiogram  EF 55-60% with no source of embolus.   Carotid Doppler  No internal carotid artery stenosis bilaterally. Vertebrals with antegrade flow bilaterally.   CXR  09/03/2011   No acute cardiopulmonary process.    EKG   Canceled  TEE no PFO, SOE  Therapy Recommendations no rehab  needs  GENERAL EXAM: Patient is in no distress  CARDIOVASCULAR: Regular rate and rhythm, no murmurs, no carotid bruits  NEUROLOGIC: MENTAL STATUS: awake, alert, language fluent, comprehension intact, naming intact CRANIAL NERVE: pupils equal and reactive to light, visual fields full to confrontation, extraocular muscles intact, no nystagmus, facial sensation and strength symmetric, uvula midline, shoulder shrug symmetric, tongue midline. MOTOR: normal bulk and tone, full strength in the BUE, BLE SENSORY: normal and symmetric to light touch, pinprick, temperature, vibration COORDINATION: finger-nose-finger, fine finger movements normal REFLEXES: deep tendon reflexes present and symmetric  ASSESSMENT Ms. Heather Baxter is a 46 y.o. female presenting with transient slurred speech, right face and right arm weakness, right hand tingling. Imaging confirms no infarct. Symptoms secondary to TIA. Inpatient stroke workup completed, including TEE. On no antiplatelets prior to admission. Now on aspirin 325 mg orally every day for secondary stroke prevention. Given history of prior symptoms and nonspecific lesion on MRI, will consider outpatient workup of non-specific white matter spots on MRI brain (i.e. Multiple sclerosis workup).  -Pemphigus, on predisone, cellcept, lamictal -cigarette smoking  Hospital day # 1  TREATMENT/PLAN - Continue aspirin 325 mg orally every day for secondary stroke prevention. - outpatient telemetry monitoring has been arranged to assess patient for atrial fibrillation as source of stroke with [facility].  - f/u Hypercoagulable results -ok for discharge from neuro standpoint -Stroke Service will sign off. Follow up with Dr. Pearlean Brownie, Stroke Clinic, in 2 months.   Annie Main, MSN, RN, ANVP-BC, ANP-BC, GNP-BC [facility] Pager: [phone] 09/04/2011 2:21 PM  Scribe for Dr. Joycelyn Schmid, who has personally reviewed chart, pertinent data,  examined the patient and developed the plan of care. Pager:  [phone]   [facility] - Stroke Team Joycelyn Schmid, MD 09/04/2011, 2:21 PM   Please refer to [email or web address] for on-call Stroke MD

## 2011-09-04 NOTE — Progress Notes (Signed)
  Echocardiogram Echocardiogram Transesophageal has been performed.  Heather Baxter FRANCES 09/04/2011, 11:43 AM

## 2011-09-04 NOTE — H&P (View-Only) (Signed)
[facility] PROGRESS NOTE  Heather Baxter WUJ:[id number] DOB: 1965-12-13 DOA: 09/03/2011 PCP: [name], [name], MD  Assessment/Plan: Principal Problem:  *TIA (transient ischemic attack) Active Problems:  Pemphigus vulgaris  Hyperlipidemia  Anxiety  Memory loss or impairment  1. TIA:  Workup so far has been negative with preliminary carotid Dopplers normal, and normal MRI/MRA and 2-D echo. Fasting lipid panel noting good LDL results patient has been on Crestor for the last one year. A1c notes borderline but no actual diagnosis of diabetes (patient counseled on weight loss) and blood pressures in the hospital have been fine. Plan is for TEE tomorrow to be done by [facility] and if normal, patient can go home. She has a followup appointment with [facility] on Thursday 09/05/11 for Holter monitor. 2. Pemphigus full there is: Stable. 3. Hyperlipidemia: Crestor. Lipid profile looks good. 4. Anxiety: Patient on when necessary Xanax. Stable. 5. Memory loss: Patient reports this has been going on for some time and husband confided that it has been much more noticeable in the past few weeks. From the sound of events, I actually suspect that this may be related to her Ambien use and advised that patient change medications. This can be managed by her PCP.  Code Status: Full code Family Communication: Discussed plan with patient and husband Disposition Plan: Home tomorrow after TEE, if normal   Brief narrative: Patient presented with arm numbness and facial droop concerning for possible TIA which resolved in the emergency room. Workup in progress  Consultants:  Stroke service  Procedures:  Status post echocardiogram done 8/13: Unremarkable  Carotid Dopplers done 8/13: Preliminary normal  Antibiotics:  None  HPI/Subjective: Patient has no complaints. No facial drooping or arm numbness or slurred speech or confusion. No headache.  Objective: Filed Vitals:   09/03/11  0937 09/03/11 1407 09/03/11 1431 09/03/11 1733  BP: 120/68 114/68 149/54 125/77  Pulse: 67 65 67 75  Temp: 98.5 F (36.9 C) 98.3 F (36.8 C) 98.4 F (36.9 C) 98.1 F (36.7 C)  TempSrc: Oral Oral Oral Oral  Resp: 18 18 18 18   Height:      Weight:      SpO2: 99% 98% 98% 98%   No intake or output data in the 24 hours ending 09/03/11 1820 Filed Weights   09/03/11 0500  Weight: 78.518 kg (173 lb 1.6 oz)    Exam:   General:  Alert and oriented x3, no acute distress, looks about stated age, fatigued  HEENT: Normocephalic, atraumatic, mucous membranes are moist  Neck: Supple no JVD no carotid bruits  Cardiovascular: Regular rate and rhythm, S1-S2  Respiratory: Clear to auscultation bilaterally  Abdomen: Soft, nontender, nondistended, positive bowel sounds  Musculoskeletal: No clubbing or cyanosis or edema  Neurological: Cranial nerves II through XII are intact, no focal deficits  Data Reviewed: Basic Metabolic Panel:  Lab 09/03/11 9562  NA 140  K 3.6  CL 107  CO2 24  GLUCOSE 154*  BUN 14  CREATININE 1.08  CALCIUM 8.6  MG --  PHOS --   Liver Function Tests:  Lab 09/03/11 0120  AST 14  ALT 13  ALKPHOS 32*  BILITOT 0.4  PROT 6.3  ALBUMIN 3.8   CBC:  Lab 09/03/11 0120  WBC 9.0  NEUTROABS --  HGB 13.5  HCT 41.0  MCV 94.5  PLT 292   Cardiac Enzymes:  Lab 09/03/11 0250  CKTOTAL --  CKMB --  CKMBINDEX --  TROPONINI <0.30   CBG:  Lab 09/03/11  1634 09/03/11 1146 09/03/11 0313  GLUCAP 104* 101* 162*     Studies: Ct Head Wo Contrast  09/03/2011 IMPRESSION: Negative CT head.    Mri Brain Without Contrast  09/03/2011   IMPRESSION: No acute infarct.  Minimal nonspecific white matter type changes as noted above.    MRA HEAD  Findings: Nonvisualization left PICA.  Duplicated left AICA.  No high-grade stenosis of the distal vertebral arteries or basilar artery.  Anterior circulation without medium or large size vessel significant stenosis or  occlusion.  Bulge of the right middle cerebral artery and left posterior communicating artery origin without discrete aneurysm as these appear to be origin of the vessels.  Mild branch vessel irregularity.  IMPRESSION: Mild branch vessel irregularity.  Dg Chest Port 1 View    IMPRESSION: No acute cardiopulmonary process.    Scheduled Meds:   . aspirin  325 mg Oral Daily  . enoxaparin (LOVENOX) injection  40 mg Subcutaneous Daily  . lamoTRIgine  400 mg Oral QHS  . mycophenolate  1,500 mg Oral BID  . pneumococcal 23 valent vaccine  0.5 mL Intramuscular Tomorrow-1000  . predniSONE  15 mg Oral Q breakfast  . zolpidem  5 mg Oral QHS   Continuous Infusions:   . sodium chloride 75 mL/hr at 09/03/11 0502     Time spent: 40 minutes    Hollice Espy  [facility] Pager [phone]. If 8PM-8AM, please contact night-coverage at [email or web address], password [facility] 09/03/2011, 6:20 PM  LOS: 0 days

## 2011-09-04 NOTE — Interval H&P Note (Signed)
History and Physical Interval Note:  09/04/2011 11:11 AM  Heather Baxter  has presented today for surgery, with the diagnosis of stroke  The various methods of treatment have been discussed with the patient and family. After consideration of risks, benefits and other options for treatment, the patient has consented to  Procedure(s) (LRB): TRANSESOPHAGEAL ECHOCARDIOGRAM (TEE) (N/A) as a surgical intervention .  The patient's history has been reviewed, patient examined, no change in status, stable for surgery.  I have reviewed the patient's chart and labs.  Questions were answered to the patient's satisfaction.     [name].

## 2011-09-05 ENCOUNTER — Encounter (HOSPITAL_COMMUNITY): Payer: Self-pay | Admitting: Cardiovascular Disease

## 2011-09-05 ENCOUNTER — Encounter (HOSPITAL_COMMUNITY): Payer: Self-pay

## 2011-09-05 ENCOUNTER — Encounter (INDEPENDENT_AMBULATORY_CARE_PROVIDER_SITE_OTHER): Payer: 59

## 2011-09-05 DIAGNOSIS — G459 Transient cerebral ischemic attack, unspecified: Secondary | ICD-10-CM

## 2011-09-11 LAB — BETA-2-GLYCOPROTEIN I ABS, IGG/M/A: Beta-2-Glycoprotein I IgA: 3 A Units (ref <20)

## 2012-01-20 DIAGNOSIS — R413 Other amnesia: Secondary | ICD-10-CM

## 2012-01-20 DIAGNOSIS — F411 Generalized anxiety disorder: Secondary | ICD-10-CM

## 2012-10-21 ENCOUNTER — Other Ambulatory Visit: Payer: Self-pay | Admitting: Obstetrics and Gynecology

## 2012-10-21 DIAGNOSIS — Z1231 Encounter for screening mammogram for malignant neoplasm of breast: Secondary | ICD-10-CM

## 2012-10-21 DIAGNOSIS — Z780 Asymptomatic menopausal state: Secondary | ICD-10-CM

## 2012-11-12 ENCOUNTER — Ambulatory Visit
Admission: RE | Admit: 2012-11-12 | Discharge: 2012-11-12 | Disposition: A | Discharge status: Home / Self Care | Payer: 59 | Source: Ambulatory Visit | Attending: Obstetrics and Gynecology | Admitting: Obstetrics and Gynecology

## 2012-11-12 DIAGNOSIS — Z1231 Encounter for screening mammogram for malignant neoplasm of breast: Secondary | ICD-10-CM

## 2012-11-12 DIAGNOSIS — IMO0002 Reserved for concepts with insufficient information to code with codable children: Secondary | ICD-10-CM

## 2012-11-12 DIAGNOSIS — Z780 Asymptomatic menopausal state: Secondary | ICD-10-CM

## 2013-11-17 LAB — HM PAP SMEAR: HM Pap smear: NEGATIVE

## 2014-04-25 ENCOUNTER — Other Ambulatory Visit: Payer: Self-pay

## 2014-04-25 DIAGNOSIS — Z9882 Breast implant status: Secondary | ICD-10-CM

## 2014-04-25 DIAGNOSIS — Z1231 Encounter for screening mammogram for malignant neoplasm of breast: Secondary | ICD-10-CM

## 2014-05-03 ENCOUNTER — Ambulatory Visit
Admission: RE | Admit: 2014-05-03 | Discharge: 2014-05-03 | Disposition: A | Discharge status: Home / Self Care | Payer: BC Managed Care – PPO | Source: Ambulatory Visit

## 2014-05-03 DIAGNOSIS — Z1231 Encounter for screening mammogram for malignant neoplasm of breast: Secondary | ICD-10-CM

## 2014-05-03 DIAGNOSIS — Z9882 Breast implant status: Secondary | ICD-10-CM

## 2015-04-14 ENCOUNTER — Encounter: Payer: Self-pay | Admitting: Obstetrics and Gynecology

## 2015-05-11 ENCOUNTER — Encounter: Payer: Self-pay | Admitting: Obstetrics and Gynecology

## 2015-06-08 ENCOUNTER — Encounter: Payer: Self-pay | Admitting: Obstetrics and Gynecology

## 2015-07-20 ENCOUNTER — Encounter: Payer: Self-pay | Admitting: Obstetrics and Gynecology

## 2015-07-20 ENCOUNTER — Ambulatory Visit (INDEPENDENT_AMBULATORY_CARE_PROVIDER_SITE_OTHER): Payer: BC Managed Care – PPO | Admitting: Obstetrics and Gynecology

## 2015-07-20 ENCOUNTER — Other Ambulatory Visit: Payer: Self-pay | Admitting: Obstetrics and Gynecology

## 2015-07-20 VITALS — BP 138/84 | HR 98 | Ht 63.0 in | Wt 189.3 lb

## 2015-07-20 DIAGNOSIS — Z01419 Encounter for gynecological examination (general) (routine) without abnormal findings: Secondary | ICD-10-CM | POA: Diagnosis not present

## 2015-07-20 DIAGNOSIS — E669 Obesity, unspecified: Secondary | ICD-10-CM

## 2015-07-20 NOTE — Progress Notes (Signed)
Subjective:   Heather BimlerJennifer A Baxter is a 50 y.o. No obstetric history on file. Caucasian female here for a routine well-woman exam.  No LMP recorded. Patient is not currently having periods (Reason: IUD).    Current complaints: none PCP: none       Does need & desire labs  Social History: Sexual: heterosexual Marital Status: divorced Living situation: with son Occupation: unknown occupation Tobacco/alcohol: no caffeine use Illicit drugs: no history of illicit drug use  The following portions of the patient's history were reviewed and updated as appropriate: allergies, current medications, past family history, past medical history, past social history, past surgical history and problem list.  Past Medical History Past Medical History  Diagnosis Date  . Anxiety   . Pemphigus   . Hyperlipidemia   . Blood dyscrasia     Past Surgical History Past Surgical History  Procedure Laterality Date  . No past surgeries    . Tee without cardioversion  09/04/2011    Procedure: TRANSESOPHAGEAL ECHOCARDIOGRAM (TEE);  Surgeon: Vesta MixerPhilip J Nahser, MD;  [facility];  Service: Cardiovascular;  Laterality: N/A;    Gynecologic History No obstetric history on file.  No LMP recorded. Patient is not currently having periods (Reason: IUD). Contraception: IUD Last Pap: 2015. Results were: normal Last mammogram: 2016. Results were: normal  Obstetric History OB History  No data available    Current Medications Current Outpatient Prescriptions on File Prior to Visit  Medication Sig Dispense Refill  . mycophenolate (CELLCEPT) 500 MG tablet Take 1,500 mg by mouth 2 (two) times daily.    . predniSONE (DELTASONE) 5 MG tablet Take 15 mg by mouth daily.    Heather Baxter. zolpidem (AMBIEN) 10 MG tablet Take 5 mg by mouth at bedtime.    . lamoTRIgine (LAMICTAL) 200 MG tablet Take 400 mg by mouth at bedtime. Reported on 07/20/2015     No current facility-administered medications on file prior to visit.    Review  of Systems Patient denies any headaches, blurred vision, shortness of breath, chest pain, abdominal pain, problems with bowel movements, urination, or intercourse.  Objective:  BP 138/84 mmHg  Pulse 98  Ht 5\' 3"  (1.6 m)  Wt 189 lb 4.8 oz (85.866 kg)  BMI 33.54 kg/m2 Physical Exam  General:  Well developed, well nourished, no acute distress. She is alert and oriented x3. Skin:  Warm and dry Neck:  Midline trachea, no thyromegaly or nodules Cardiovascular: Regular rate and rhythm, no murmur heard Lungs:  Effort normal, all lung fields clear to auscultation bilaterally Breasts:  No dominant palpable mass, retraction, or nipple discharge Abdomen:  Soft, non tender, no hepatosplenomegaly or masses Pelvic:  External genitalia is normal in appearance.  The vagina is normal in appearance. The cervix is bulbous, no CMT.  Thin prep pap is done with HR HPV cotesting. Uterus is felt to be normal size, shape, and contour.  No adnexal masses or tenderness noted. Extremities:  No swelling or varicosities noted Psych:  She has a normal mood and affect  Assessment:   Healthy well-woman exam   Plan:  Labs obtained F/U 1 year  for AE, or sooner if needed Mammogram scheduled  Heather Baxter, CNM

## 2015-07-20 NOTE — Patient Instructions (Signed)
  Place annual gynecologic exam patient instructions here.  Thank you for enrolling in MyChart. Please follow the instructions below to securely access your online medical record. MyChart allows you to send messages to your doctor, view your test results, manage appointments, and more.   How Do I Sign Up? 1. In your Internet browser, go to [employer] Address [employer] and enter [email or web address]. 2. Click on the Sign Up Now link in the Sign In box. You will see the New Member Sign Up page. 3. Enter your MyChart Access Code exactly as it appears below. You will not need to use this code after you've completed the sign-up process. If you do not sign up before the expiration date, you must request a new code.  MyChart Access Code: [email or web address] Expires: 07/23/2015  1:22 PM  4. Enter your Social Security Number ([id number]) and Date of Birth (mm/dd/yyyy) as indicated and click Submit. You will be taken to the next sign-up page. 5. Create a MyChart ID. This will be your MyChart login ID and cannot be changed, so think of one that is secure and easy to remember. 6. Create a MyChart password. You can change your password at any time. 7. Enter your Password Reset Question and Answer. This can be used at a later time if you forget your password.  8. Enter your e-mail address. You will receive e-mail notification when new information is available in MyChart. 9. Click Sign Up. You can now view your medical record.   Additional Information Remember, MyChart is NOT to be used for urgent needs. For medical emergencies, dial 911.

## 2015-07-26 LAB — CYTOLOGY - PAP

## 2015-08-07 ENCOUNTER — Telehealth: Payer: Self-pay | Admitting: *Deleted

## 2015-08-07 NOTE — Telephone Encounter (Signed)
-----   Message from Purcell NailsMelody N Shambley, [place] sent at 07/28/2015  6:15 PM EDT ----- Please let her know her know her pap was negative, but HPV is + so will need to repeat pap next year.

## 2015-08-07 NOTE — Telephone Encounter (Signed)
Mailed info to pt 

## 2015-08-22 ENCOUNTER — Ambulatory Visit: Payer: BC Managed Care – PPO | Admitting: Family Medicine

## 2015-09-01 ENCOUNTER — Other Ambulatory Visit: Payer: Self-pay | Admitting: Obstetrics and Gynecology

## 2015-09-01 DIAGNOSIS — Z1231 Encounter for screening mammogram for malignant neoplasm of breast: Secondary | ICD-10-CM

## 2015-09-08 ENCOUNTER — Other Ambulatory Visit: Payer: Self-pay | Admitting: Obstetrics and Gynecology

## 2015-09-08 ENCOUNTER — Ambulatory Visit
Admission: RE | Admit: 2015-09-08 | Discharge: 2015-09-08 | Disposition: A | Discharge status: Home / Self Care | Payer: BC Managed Care – PPO | Source: Ambulatory Visit | Attending: Obstetrics and Gynecology | Admitting: Obstetrics and Gynecology

## 2015-09-08 DIAGNOSIS — Z1231 Encounter for screening mammogram for malignant neoplasm of breast: Secondary | ICD-10-CM

## 2016-01-02 ENCOUNTER — Other Ambulatory Visit: Payer: Self-pay

## 2016-01-11 ENCOUNTER — Other Ambulatory Visit: Payer: Self-pay | Admitting: *Deleted

## 2016-01-11 MED ORDER — ROSUVASTATIN CALCIUM 10 MG PO TABS
10.0000 mg | ORAL_TABLET | Freq: Every day | ORAL | 2 refills | Status: AC
Start: 2016-01-11 — End: ?

## 2016-02-28 ENCOUNTER — Other Ambulatory Visit: Payer: Self-pay | Admitting: *Deleted

## 2016-02-28 ENCOUNTER — Encounter: Payer: Self-pay | Admitting: Obstetrics and Gynecology

## 2016-02-28 MED ORDER — METRONIDAZOLE 500 MG PO TABS: 500.0000 mg | ORAL_TABLET | Freq: Two times a day (BID) | ORAL | 2 refills | Status: DC

## 2016-07-23 ENCOUNTER — Encounter: Payer: BC Managed Care – PPO | Admitting: Obstetrics and Gynecology

## 2016-08-22 ENCOUNTER — Other Ambulatory Visit: Payer: Self-pay | Admitting: Obstetrics and Gynecology

## 2016-08-22 DIAGNOSIS — Z1231 Encounter for screening mammogram for malignant neoplasm of breast: Secondary | ICD-10-CM

## 2016-09-08 ENCOUNTER — Other Ambulatory Visit: Payer: Self-pay | Admitting: Obstetrics and Gynecology

## 2016-09-09 ENCOUNTER — Ambulatory Visit
Admission: RE | Admit: 2016-09-09 | Discharge: 2016-09-09 | Disposition: A | Discharge status: Home / Self Care | Payer: BC Managed Care – PPO | Source: Ambulatory Visit | Attending: Obstetrics and Gynecology | Admitting: Obstetrics and Gynecology

## 2016-09-09 DIAGNOSIS — Z1231 Encounter for screening mammogram for malignant neoplasm of breast: Secondary | ICD-10-CM | POA: Diagnosis not present

## 2016-09-09 DIAGNOSIS — Z9882 Breast implant status: Secondary | ICD-10-CM | POA: Diagnosis not present

## 2016-09-23 ENCOUNTER — Emergency Department: Payer: BC Managed Care – PPO

## 2016-09-23 ENCOUNTER — Encounter: Payer: Self-pay | Admitting: Intensive Care

## 2016-09-23 ENCOUNTER — Emergency Department
Admission: EM | Admit: 2016-09-23 | Discharge: 2016-09-23 | Disposition: A | Discharge status: Home / Self Care | Payer: BC Managed Care – PPO | Attending: Emergency Medicine | Admitting: Emergency Medicine

## 2016-09-23 DIAGNOSIS — Z7982 Long term (current) use of aspirin: Secondary | ICD-10-CM | POA: Diagnosis not present

## 2016-09-23 DIAGNOSIS — S0990XA Unspecified injury of head, initial encounter: Secondary | ICD-10-CM | POA: Diagnosis present

## 2016-09-23 DIAGNOSIS — M533 Sacrococcygeal disorders, not elsewhere classified: Secondary | ICD-10-CM

## 2016-09-23 DIAGNOSIS — Y939 Activity, unspecified: Secondary | ICD-10-CM | POA: Insufficient documentation

## 2016-09-23 DIAGNOSIS — Z87891 Personal history of nicotine dependence: Secondary | ICD-10-CM | POA: Insufficient documentation

## 2016-09-23 DIAGNOSIS — Y929 Unspecified place or not applicable: Secondary | ICD-10-CM | POA: Insufficient documentation

## 2016-09-23 DIAGNOSIS — Z79899 Other long term (current) drug therapy: Secondary | ICD-10-CM | POA: Diagnosis not present

## 2016-09-23 DIAGNOSIS — Z8673 Personal history of transient ischemic attack (TIA), and cerebral infarction without residual deficits: Secondary | ICD-10-CM | POA: Insufficient documentation

## 2016-09-23 DIAGNOSIS — Y999 Unspecified external cause status: Secondary | ICD-10-CM | POA: Insufficient documentation

## 2016-09-23 DIAGNOSIS — S161XXA Strain of muscle, fascia and tendon at neck level, initial encounter: Secondary | ICD-10-CM | POA: Diagnosis not present

## 2016-09-23 MED ORDER — ACETAMINOPHEN 500 MG PO TABS
1000.0000 mg | ORAL_TABLET | Freq: Once | ORAL | Status: AC
  Administered 2016-09-23: 1000 mg via ORAL
  Filled 2016-09-23: qty 2

## 2016-09-23 MED ORDER — IBUPROFEN 600 MG PO TABS
600.0000 mg | ORAL_TABLET | Freq: Once | ORAL | Status: AC
  Administered 2016-09-23: 600 mg via ORAL
  Filled 2016-09-23: qty 1

## 2016-09-23 MED ORDER — IBUPROFEN 600 MG PO TABS: 600.0000 mg | ORAL_TABLET | Freq: Three times a day (TID) | ORAL | 0 refills | Status: AC | PRN

## 2016-09-23 MED ORDER — CYCLOBENZAPRINE HCL 10 MG PO TABS: 10.0000 mg | ORAL_TABLET | Freq: Three times a day (TID) | ORAL | 0 refills | Status: AC | PRN
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
## 2016-09-23 NOTE — ED Notes (Signed)
ED Provider at bedside. 

## 2016-09-23 NOTE — ED Notes (Signed)
Patient reports she has a HX of anxiety

## 2016-09-23 NOTE — ED Notes (Signed)
Pt back from CT
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
## 2016-09-23 NOTE — ED Provider Notes (Deleted)
Medical screening examination/treatment/procedure(s) were performed by non-physician practitioner and as supervising physician I was immediately available for consultation/collaboration.     Governor RooksLord, Mikhaila Roh, MD 09/23/16 [phone]
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
## 2016-09-25 ENCOUNTER — Encounter: Payer: Self-pay | Admitting: Obstetrics and Gynecology

## 2016-09-25 ENCOUNTER — Ambulatory Visit (INDEPENDENT_AMBULATORY_CARE_PROVIDER_SITE_OTHER): Payer: BC Managed Care – PPO | Admitting: Certified Nurse Midwife

## 2016-09-25 VITALS — BP 118/78 | HR 98 | Wt 183.9 lb

## 2016-09-25 DIAGNOSIS — Z30432 Encounter for removal of intrauterine contraceptive device: Secondary | ICD-10-CM

## 2016-09-25 NOTE — Progress Notes (Signed)
  GYNECOLOGY OFFICE PROCEDURE NOTE  Rolland BimlerJennifer A Pechacek is a 51 y.o. No obstetric history on file. here for IUD removal. No GYN concerns.  Last pap smear was on 07/20/15 and was normal, HPV detected.  IUD Removal  Patient identified, informed consent performed, consent signed.  Patient was in the dorsal lithotomy position, normal external genitalia was noted.  A speculum was placed in the patient's vagina, normal discharge was noted, no lesions. The cervix was visualized, no lesions, no abnormal discharge.  The strings of the IUD were grasped and pulled using ring forceps. The IUD was removed in its entirety.   Patient tolerated the procedure well.    Patient partner has vasectomy.  Counseled on use of condoms to prevent STD's. Routine preventative health maintenance measures emphasized.  Doreene BurkeAnnie Maxum Cassarino, CNM

## 2016-09-25 NOTE — Patient Instructions (Signed)
Preventive Care 40-64 Years, Female Preventive care refers to lifestyle choices and visits with your health care provider that can promote health and wellness. What does preventive care include?  A yearly physical exam. This is also called an annual well check.  Dental exams once or twice a year.  Routine eye exams. Ask your health care provider how often you should have your eyes checked.  Personal lifestyle choices, including: ? Daily care of your teeth and gums. ? Regular physical activity. ? Eating a healthy diet. ? Avoiding tobacco and drug use. ? Limiting alcohol use. ? Practicing safe sex. ? Taking low-dose aspirin daily starting at age 58. ? Taking vitamin and mineral supplements as recommended by your health care provider. What happens during an annual well check? The services and screenings done by your health care provider during your annual well check will depend on your age, overall health, lifestyle risk factors, and family history of disease. Counseling Your health care provider may ask you questions about your:  Alcohol use.  Tobacco use.  Drug use.  Emotional well-being.  Home and relationship well-being.  Sexual activity.  Eating habits.  Work and work Statistician.  Method of birth control.  Menstrual cycle.  Pregnancy history.  Screening You may have the following tests or measurements:  Height, weight, and BMI.  Blood pressure.  Lipid and cholesterol levels. These may be checked every 5 years, or more frequently if you are over 81 years old.  Skin check.  Lung cancer screening. You may have this screening every year starting at age 78 if you have a 30-pack-year history of smoking and currently smoke or have quit within the past 15 years.  Fecal occult blood test (FOBT) of the stool. You may have this test every year starting at age 65.  Flexible sigmoidoscopy or colonoscopy. You may have a sigmoidoscopy every 5 years or a colonoscopy  every 10 years starting at age 30.  Hepatitis C blood test.  Hepatitis B blood test.  Sexually transmitted disease (STD) testing.  Diabetes screening. This is done by checking your blood sugar (glucose) after you have not eaten for a while (fasting). You may have this done every 1-3 years.  Mammogram. This may be done every 1-2 years. Talk to your health care provider about when you should start having regular mammograms. This may depend on whether you have a family history of breast cancer.  BRCA-related cancer screening. This may be done if you have a family history of breast, ovarian, tubal, or peritoneal cancers.  Pelvic exam and Pap test. This may be done every 3 years starting at age 80. Starting at age 36, this may be done every 5 years if you have a Pap test in combination with an HPV test.  Bone density scan. This is done to screen for osteoporosis. You may have this scan if you are at high risk for osteoporosis.  Discuss your test results, treatment options, and if necessary, the need for more tests with your health care provider. Vaccines Your health care provider may recommend certain vaccines, such as:  Influenza vaccine. This is recommended every year.  Tetanus, diphtheria, and acellular pertussis (Tdap, Td) vaccine. You may need a Td booster every 10 years.  Varicella vaccine. You may need this if you have not been vaccinated.  Zoster vaccine. You may need this after age 5.  Measles, mumps, and rubella (MMR) vaccine. You may need at least one dose of MMR if you were born in  1955 or later. You may also need a second dose.  Pneumococcal 13-valent conjugate (PCV13) vaccine. You may need this if you have certain conditions and were not previously vaccinated.  Pneumococcal polysaccharide (PPSV23) vaccine. You may need one or two doses if you smoke cigarettes or if you have certain conditions.  Meningococcal vaccine. You may need this if you have certain  conditions.  Hepatitis A vaccine. You may need this if you have certain conditions or if you travel or work in places where you may be exposed to hepatitis A.  Hepatitis B vaccine. You may need this if you have certain conditions or if you travel or work in places where you may be exposed to hepatitis B.  Haemophilus influenzae type b (Hib) vaccine. You may need this if you have certain conditions.  Talk to your health care provider about which screenings and vaccines you need and how often you need them. This information is not intended to replace advice given to you by your health care provider. Make sure you discuss any questions you have with your health care provider. Document Released: 02/03/2015 Document Revised: 09/27/2015 Document Reviewed: 11/08/2014 Elsevier Interactive Patient Education  2017 [employer].

## 2016-10-03 ENCOUNTER — Encounter: Payer: BC Managed Care – PPO | Admitting: Obstetrics and Gynecology

## 2016-12-25 ENCOUNTER — Encounter: Payer: Self-pay | Admitting: Obstetrics and Gynecology

## 2016-12-25 ENCOUNTER — Ambulatory Visit (INDEPENDENT_AMBULATORY_CARE_PROVIDER_SITE_OTHER): Payer: BC Managed Care – PPO | Admitting: Obstetrics and Gynecology

## 2016-12-25 VITALS — BP 127/78 | HR 82 | Ht 63.0 in | Wt 183.1 lb

## 2016-12-25 DIAGNOSIS — Z975 Presence of (intrauterine) contraceptive device: Secondary | ICD-10-CM | POA: Diagnosis not present

## 2016-12-25 LAB — POCT URINE PREGNANCY: Preg Test, Ur: NEGATIVE

## 2016-12-25 NOTE — Progress Notes (Signed)
Rolland BimlerJennifer A Murley is a 51 y.o. year old G2P2 Caucasian female who presents for placement of a Mirena IUD.  No LMP recorded. Patient is not currently having periods (Reason: Other). BP 127/78   Pulse 82   Ht 5\' 3"  (1.6 m)   Wt 183 lb 1.6 oz (83.1 kg)   BMI 32.43 kg/m  Last sexual intercourse was 2 weeks ago, and pregnancy test today was negative  The risks and benefits of the method and placement have been thouroughly reviewed with the patient and all questions were answered.  Specifically the patient is aware of failure rate of 01/998, expulsion of the IUD and of possible perforation.  The patient is aware of irregular bleeding due to the method and understands the incidence of irregular bleeding diminishes with time.  Signed copy of informed consent in chart.   Time out was performed.  A graves speculum was placed in the vagina.  The cervix was visualized, prepped using Betadine, and grasped with a single tooth tenaculum. The uterus was found to be neutral and it sounded to 7 cm.  Mirena IUD placed per manufacturer's recommendations.   The strings were trimmed to 3 cm.    The patient was given post procedure instructions, including signs and symptoms of infection and to check for the strings after each menses or each month, and refraining from intercourse or anything in the vagina for 3 days.  She was given a Mirena care card with date Mirena placed, and date Mirena to be removed.    Hasson Gaspard Suzan NailerN [place], CNM

## 2017-03-03 ENCOUNTER — Other Ambulatory Visit: Payer: Self-pay | Admitting: *Deleted

## 2017-03-03 MED ORDER — FLUTICASONE PROPIONATE 50 MCG/ACT NA SUSP: 1.0000 | Freq: Every day | NASAL | 2 refills | Status: DC

## 2017-04-19 ENCOUNTER — Other Ambulatory Visit: Payer: Self-pay | Admitting: Obstetrics and Gynecology

## 2017-05-13 ENCOUNTER — Other Ambulatory Visit: Payer: Self-pay | Admitting: Obstetrics and Gynecology

## 2017-05-28 ENCOUNTER — Encounter: Payer: Self-pay | Admitting: *Deleted

## 2017-05-28 ENCOUNTER — Encounter: Payer: Self-pay | Admitting: Obstetrics and Gynecology

## 2017-06-19 ENCOUNTER — Other Ambulatory Visit: Payer: Self-pay | Admitting: Obstetrics and Gynecology

## 2017-11-04 ENCOUNTER — Other Ambulatory Visit: Payer: Self-pay | Admitting: Pediatrics

## 2017-11-04 ENCOUNTER — Other Ambulatory Visit: Payer: Self-pay | Admitting: Obstetrics and Gynecology

## 2017-11-04 DIAGNOSIS — Z1231 Encounter for screening mammogram for malignant neoplasm of breast: Secondary | ICD-10-CM

## 2017-12-23 ENCOUNTER — Other Ambulatory Visit: Payer: Self-pay | Admitting: Obstetrics and Gynecology

## 2017-12-29 ENCOUNTER — Ambulatory Visit
Admission: RE | Admit: 2017-12-29 | Discharge: 2017-12-29 | Disposition: A | Discharge status: Home / Self Care | Payer: BC Managed Care – PPO | Source: Ambulatory Visit | Attending: Pediatrics | Admitting: Pediatrics

## 2017-12-29 DIAGNOSIS — Z1231 Encounter for screening mammogram for malignant neoplasm of breast: Secondary | ICD-10-CM | POA: Insufficient documentation

## 2018-11-01 IMAGING — MG 2D DIGITAL SCREENING BILATERAL MAMMOGRAM WITH IMPLANTS, CAD AND
8 of 16 series · 8 of 32 positions shown · non-contrast
Comparison: Previous exam(s).

CLINICAL DATA: Screening.

EXAM:
2D DIGITAL SCREENING BILATERAL MAMMOGRAM WITH IMPLANTS, CAD AND
ADJUNCT TOMO
The patient has retroglandular implants. Standard and implant
displaced views were performed.

[L CC (1 of 2)]
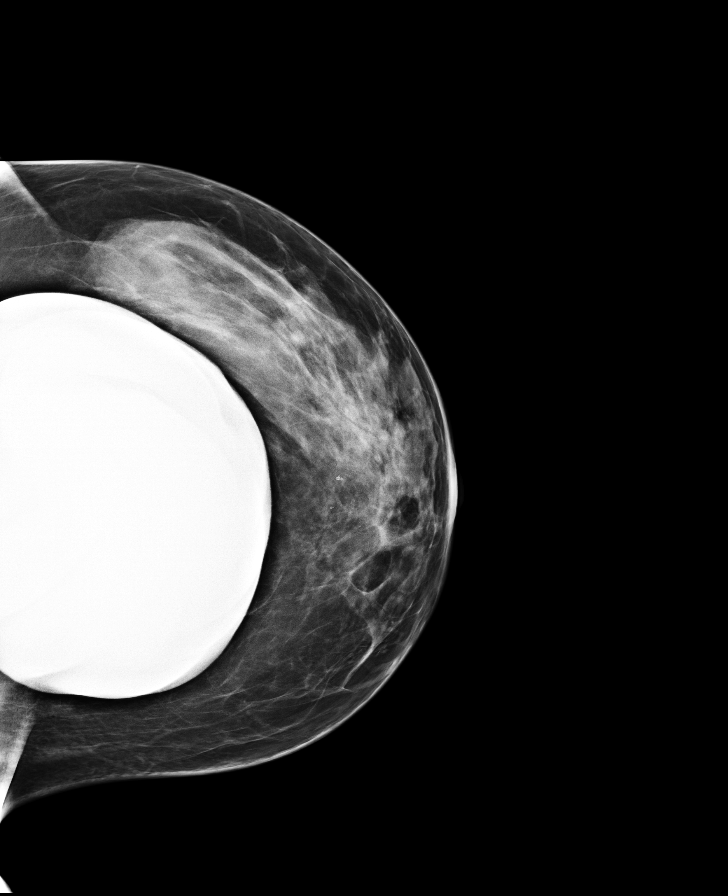

[L MLO (1 of 2)]
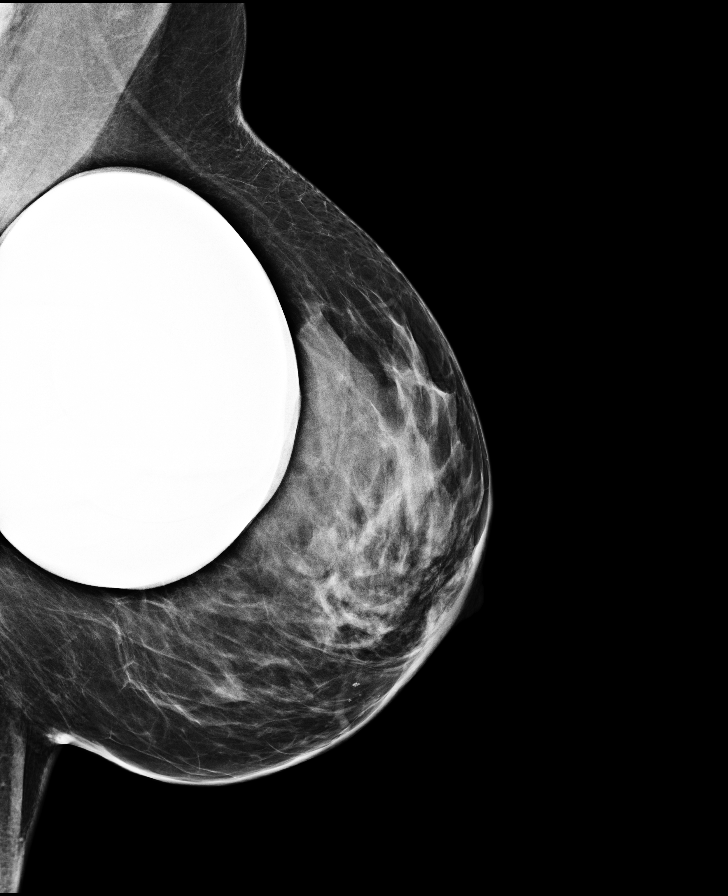

[R MLO]
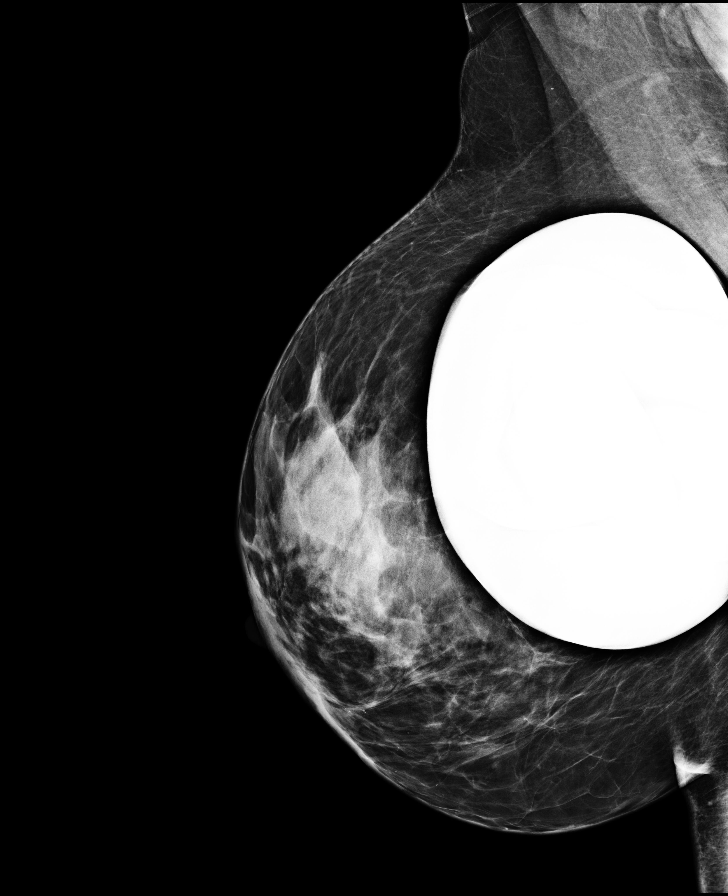

[R CC]
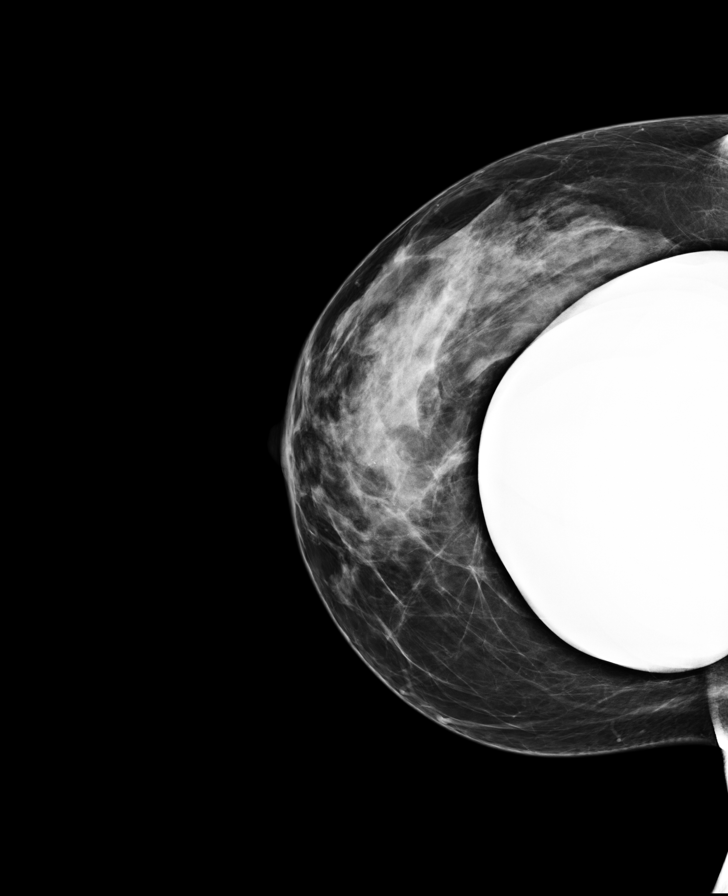

[R CC synth-2D]
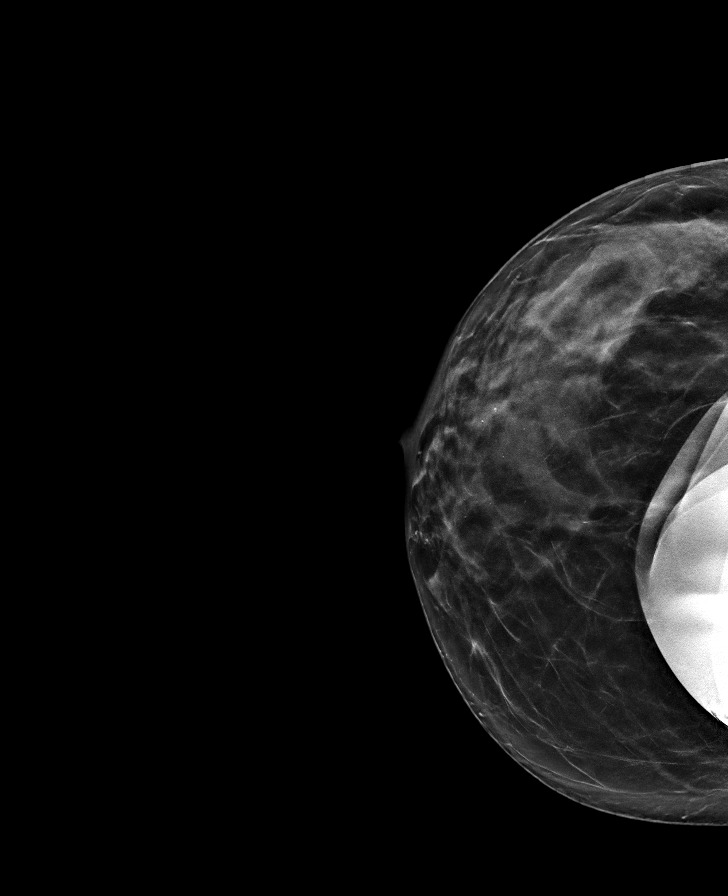

[L CC (2 of 2)]
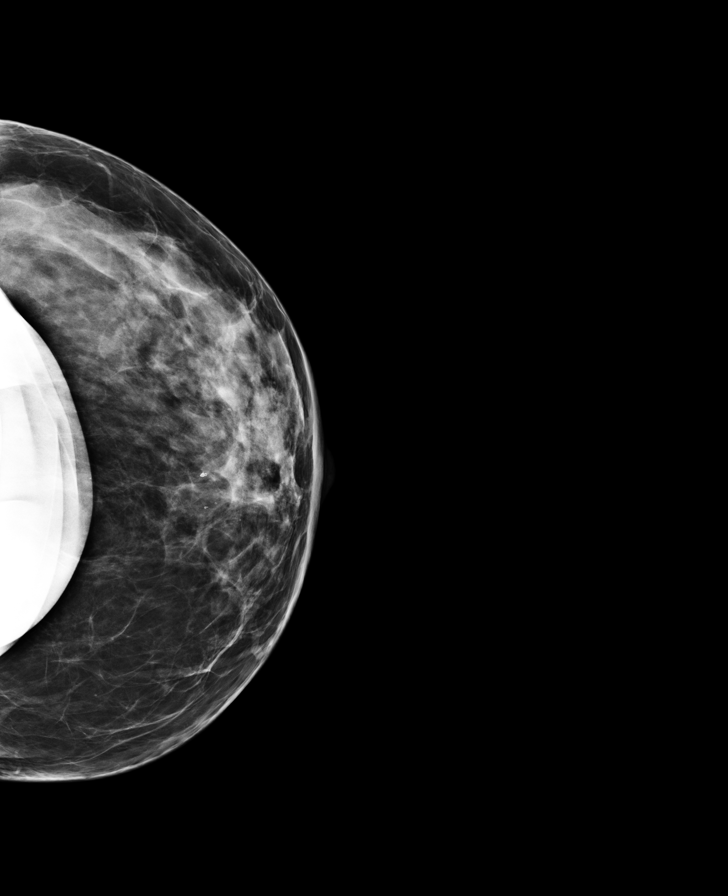

[L MLO (2 of 2)]
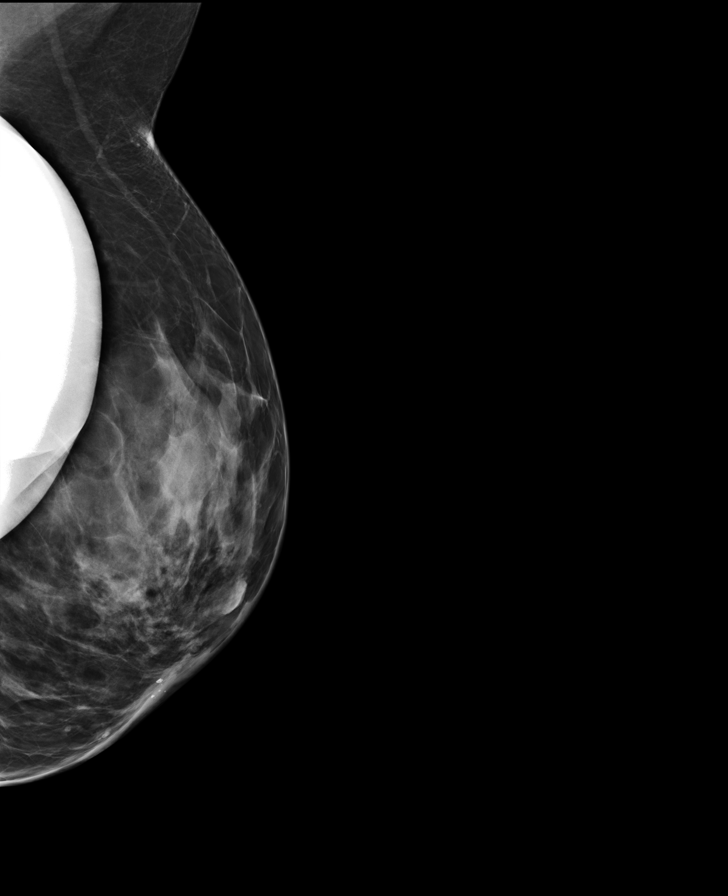

[R MLO synth-2D]
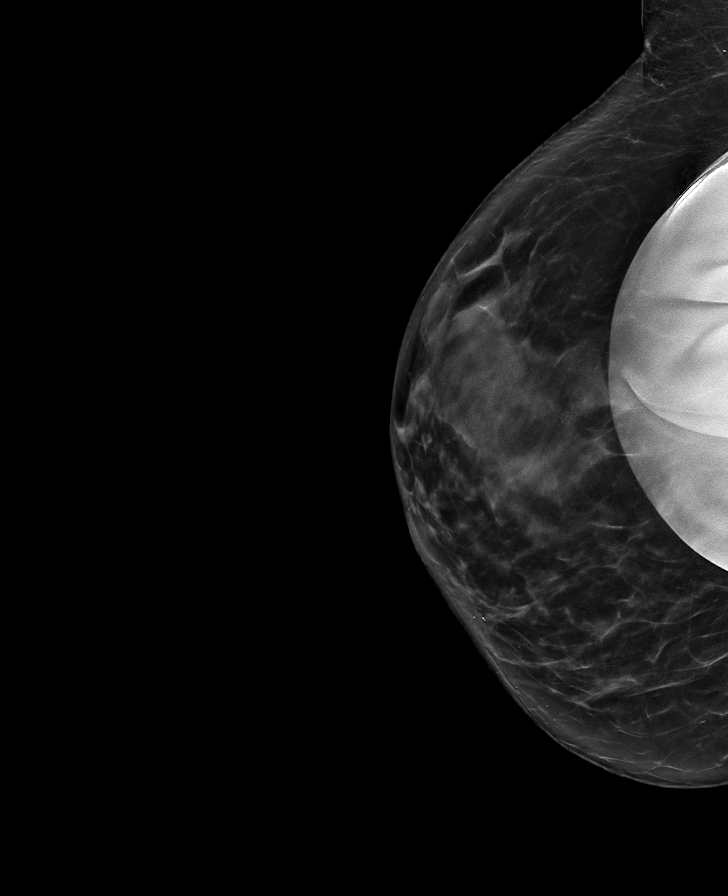

[8 of 32 positions shown; findings below may reference images not displayed]

ACR Breast Density Category c: The breast tissue is heterogeneously
dense, which may obscure small masses.
FINDINGS: There are no findings suspicious for malignancy. Images were
processed with CAD.
IMPRESSION: No mammographic evidence of malignancy. A result letter of this
screening mammogram will be mailed directly to the patient.

RECOMMENDATION:
Screening mammogram in one year. (Code:LN-[id number])

BI-RADS CATEGORY  1:  Negative.
